# Patient Record
Sex: Female | Born: 1985
Health system: Southern US, Community
[De-identification: ages and names within clinical notes are randomized; demographics above are authoritative.]

## PROBLEM LIST (undated history)

## (undated) ENCOUNTER — Inpatient Hospital Stay (HOSPITAL_COMMUNITY): Payer: Self-pay

## (undated) DIAGNOSIS — A549 Gonococcal infection, unspecified: Secondary | ICD-10-CM

## (undated) DIAGNOSIS — F32A Depression, unspecified: Secondary | ICD-10-CM

## (undated) DIAGNOSIS — N390 Urinary tract infection, site not specified: Secondary | ICD-10-CM

## (undated) DIAGNOSIS — A749 Chlamydial infection, unspecified: Secondary | ICD-10-CM

## (undated) DIAGNOSIS — O24419 Gestational diabetes mellitus in pregnancy, unspecified control: Secondary | ICD-10-CM

## (undated) DIAGNOSIS — O139 Gestational [pregnancy-induced] hypertension without significant proteinuria, unspecified trimester: Secondary | ICD-10-CM

## (undated) DIAGNOSIS — K810 Acute cholecystitis: Secondary | ICD-10-CM

## (undated) DIAGNOSIS — F329 Major depressive disorder, single episode, unspecified: Secondary | ICD-10-CM

## (undated) DIAGNOSIS — K219 Gastro-esophageal reflux disease without esophagitis: Secondary | ICD-10-CM

## (undated) HISTORY — PX: INDUCED ABORTION: SHX677

---
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
## 2007-10-02 ENCOUNTER — Emergency Department (HOSPITAL_COMMUNITY): Admission: EM | Admit: 2007-10-02 | Discharge: 2007-10-03 | Payer: Self-pay | Admitting: Emergency Medicine

## 2008-02-10 ENCOUNTER — Emergency Department (HOSPITAL_COMMUNITY): Admission: EM | Admit: 2008-02-10 | Discharge: 2008-02-10 | Payer: Self-pay | Admitting: Emergency Medicine

## 2008-06-10 ENCOUNTER — Emergency Department (HOSPITAL_COMMUNITY): Admission: EM | Admit: 2008-06-10 | Discharge: 2008-06-10 | Payer: Self-pay | Admitting: Emergency Medicine

## 2008-10-07 ENCOUNTER — Inpatient Hospital Stay (HOSPITAL_COMMUNITY): Admission: AD | Admit: 2008-10-07 | Discharge: 2008-10-07 | Payer: Self-pay | Admitting: Family Medicine

## 2009-03-26 ENCOUNTER — Emergency Department (HOSPITAL_BASED_OUTPATIENT_CLINIC_OR_DEPARTMENT_OTHER): Admission: EM | Admit: 2009-03-26 | Discharge: 2009-03-26 | Payer: Self-pay | Admitting: Emergency Medicine

## 2009-04-07 ENCOUNTER — Emergency Department (HOSPITAL_BASED_OUTPATIENT_CLINIC_OR_DEPARTMENT_OTHER): Admission: EM | Admit: 2009-04-07 | Discharge: 2009-04-07 | Payer: Self-pay | Admitting: Emergency Medicine

## 2009-04-25 ENCOUNTER — Emergency Department (HOSPITAL_COMMUNITY): Admission: EM | Admit: 2009-04-25 | Discharge: 2009-04-25 | Payer: Self-pay | Admitting: Family Medicine

## 2009-05-20 ENCOUNTER — Ambulatory Visit: Payer: Self-pay | Admitting: Diagnostic Radiology

## 2009-05-20 ENCOUNTER — Emergency Department (HOSPITAL_BASED_OUTPATIENT_CLINIC_OR_DEPARTMENT_OTHER): Admission: EM | Admit: 2009-05-20 | Discharge: 2009-05-21 | Payer: Self-pay | Admitting: Emergency Medicine

## 2009-09-22 ENCOUNTER — Emergency Department (HOSPITAL_COMMUNITY): Admission: EM | Admit: 2009-09-22 | Discharge: 2009-09-22 | Payer: Self-pay | Admitting: Emergency Medicine

## 2009-10-22 ENCOUNTER — Inpatient Hospital Stay (HOSPITAL_COMMUNITY): Admission: AD | Admit: 2009-10-22 | Discharge: 2009-10-22 | Payer: Self-pay | Admitting: Obstetrics & Gynecology

## 2009-11-18 ENCOUNTER — Ambulatory Visit (HOSPITAL_COMMUNITY): Admission: RE | Admit: 2009-11-18 | Discharge: 2009-11-18 | Payer: Self-pay | Admitting: *Deleted

## 2009-11-27 ENCOUNTER — Ambulatory Visit (HOSPITAL_COMMUNITY): Admission: RE | Admit: 2009-11-27 | Discharge: 2009-11-27 | Payer: Self-pay | Admitting: *Deleted

## 2009-12-18 ENCOUNTER — Ambulatory Visit (HOSPITAL_COMMUNITY): Admission: RE | Admit: 2009-12-18 | Discharge: 2009-12-18 | Payer: Self-pay | Admitting: Family Medicine

## 2010-01-01 ENCOUNTER — Ambulatory Visit (HOSPITAL_COMMUNITY): Admission: RE | Admit: 2010-01-01 | Discharge: 2010-01-01 | Payer: Self-pay | Admitting: Obstetrics & Gynecology

## 2010-05-15 ENCOUNTER — Ambulatory Visit: Payer: Self-pay | Admitting: Physician Assistant

## 2010-05-15 ENCOUNTER — Inpatient Hospital Stay (HOSPITAL_COMMUNITY): Admission: AD | Admit: 2010-05-15 | Discharge: 2010-05-16 | Payer: Self-pay | Admitting: Family Medicine

## 2010-05-22 ENCOUNTER — Inpatient Hospital Stay (HOSPITAL_COMMUNITY): Admission: AD | Admit: 2010-05-22 | Discharge: 2010-05-22 | Payer: Self-pay | Admitting: Obstetrics & Gynecology

## 2010-06-01 ENCOUNTER — Inpatient Hospital Stay (HOSPITAL_COMMUNITY): Admission: AD | Admit: 2010-06-01 | Discharge: 2010-06-04 | Payer: Self-pay | Admitting: Obstetrics

## 2010-06-10 ENCOUNTER — Ambulatory Visit: Payer: Self-pay | Admitting: Obstetrics and Gynecology

## 2010-06-10 ENCOUNTER — Inpatient Hospital Stay (HOSPITAL_COMMUNITY): Admission: AD | Admit: 2010-06-10 | Discharge: 2010-06-13 | Payer: Self-pay | Admitting: Obstetrics

## 2010-09-13 ENCOUNTER — Emergency Department (HOSPITAL_COMMUNITY): Admission: EM | Admit: 2010-09-13 | Discharge: 2010-09-13 | Payer: Self-pay | Admitting: Family Medicine

## 2010-12-18 ENCOUNTER — Ambulatory Visit (HOSPITAL_COMMUNITY)
Admission: RE | Admit: 2010-12-18 | Discharge: 2010-12-18 | Payer: Self-pay | Source: Home / Self Care | Attending: Obstetrics & Gynecology | Admitting: Obstetrics & Gynecology

## 2011-03-11 LAB — WET PREP, GENITAL
Trich, Wet Prep: NONE SEEN
Yeast Wet Prep HPF POC: NONE SEEN

## 2011-03-11 LAB — HEMOCCULT GUIAC POC 1CARD (OFFICE): Fecal Occult Bld: NEGATIVE

## 2011-03-11 LAB — POCT URINALYSIS DIPSTICK
Bilirubin Urine: NEGATIVE
Glucose, UA: NEGATIVE mg/dL
Hgb urine dipstick: NEGATIVE
Protein, ur: 30 mg/dL — AB

## 2011-03-11 LAB — GC/CHLAMYDIA PROBE AMP, GENITAL
Chlamydia, DNA Probe: NEGATIVE
GC Probe Amp, Genital: NEGATIVE

## 2011-03-14 LAB — COMPREHENSIVE METABOLIC PANEL
AST: 26 U/L (ref 0–37)
Albumin: 2.9 g/dL — ABNORMAL LOW (ref 3.5–5.2)
Alkaline Phosphatase: 321 U/L — ABNORMAL HIGH (ref 39–117)
BUN: 2 mg/dL — ABNORMAL LOW (ref 6–23)
BUN: 6 mg/dL (ref 6–23)
CO2: 25 mEq/L (ref 19–32)
Calcium: 8.4 mg/dL (ref 8.4–10.5)
Chloride: 108 mEq/L (ref 96–112)
Creatinine, Ser: 0.64 mg/dL (ref 0.4–1.2)
GFR calc non Af Amer: >60 mL/min (ref >60)
Glucose, Bld: 68 mg/dL — ABNORMAL LOW (ref 70–99)
Glucose, Bld: 99 mg/dL (ref 70–99)
Potassium: 3.6 mEq/L (ref 3.5–5.1)
Potassium: 3.8 mEq/L (ref 3.5–5.1)
Sodium: 138 mEq/L (ref 135–145)
Total Bilirubin: 0.5 mg/dL (ref 0.3–1.2)
Total Protein: 6.1 g/dL (ref 6.0–8.3)

## 2011-03-14 LAB — CBC
HCT: 32.1 % — ABNORMAL LOW (ref 36.0–46.0)
HCT: 33 % — ABNORMAL LOW (ref 36.0–46.0)
Hemoglobin: 10.5 g/dL — ABNORMAL LOW (ref 12.0–15.0)
MCHC: 32.6 g/dL (ref 30.0–36.0)
MCV: 90 fL (ref 78.0–100.0)
MCV: 90.5 fL (ref 78.0–100.0)
Platelets: 374 10*3/uL (ref 150–400)
Platelets: 382 10*3/uL (ref 150–400)
RBC: 3.66 MIL/uL — ABNORMAL LOW (ref 3.87–5.11)
RDW: 17.6 % — ABNORMAL HIGH (ref 11.5–15.5)

## 2011-03-14 LAB — URINALYSIS, ROUTINE W REFLEX MICROSCOPIC
Glucose, UA: NEGATIVE mg/dL
Ketones, ur: NEGATIVE mg/dL

## 2011-03-14 LAB — LACTATE DEHYDROGENASE: LDH: 260 U/L — ABNORMAL HIGH (ref 94–250)

## 2011-03-14 LAB — URINE MICROSCOPIC-ADD ON

## 2011-03-14 LAB — MRSA PCR SCREENING: MRSA by PCR: NEGATIVE

## 2011-03-14 LAB — URIC ACID: Uric Acid, Serum: 7.2 mg/dL — ABNORMAL HIGH (ref 2.4–7.0)

## 2011-03-15 LAB — CBC
MCHC: 33.4 g/dL (ref 30.0–36.0)
Platelets: 174 10*3/uL (ref 150–400)
RBC: 3.17 MIL/uL — ABNORMAL LOW (ref 3.87–5.11)
RBC: 3.62 MIL/uL — ABNORMAL LOW (ref 3.87–5.11)
RDW: 16.3 % — ABNORMAL HIGH (ref 11.5–15.5)
WBC: 9.9 10*3/uL (ref 4.0–10.5)

## 2011-03-15 LAB — WET PREP, GENITAL

## 2011-03-15 LAB — RPR: RPR Ser Ql: NONREACTIVE

## 2011-04-01 LAB — URINALYSIS, ROUTINE W REFLEX MICROSCOPIC
Bilirubin Urine: NEGATIVE
Ketones, ur: NEGATIVE mg/dL
Nitrite: NEGATIVE
Protein, ur: NEGATIVE mg/dL
Urobilinogen, UA: 0.2 mg/dL (ref 0.0–1.0)

## 2011-04-01 LAB — CBC
HCT: 35.1 % — ABNORMAL LOW (ref 36.0–46.0)
Hemoglobin: 11.7 g/dL — ABNORMAL LOW (ref 12.0–15.0)
MCHC: 33.3 g/dL (ref 30.0–36.0)
MCV: 93.3 fL (ref 78.0–100.0)
RDW: 13.9 % (ref 11.5–15.5)

## 2011-04-01 LAB — GC/CHLAMYDIA PROBE AMP, GENITAL: Chlamydia, DNA Probe: NEGATIVE

## 2011-04-01 LAB — WET PREP, GENITAL

## 2011-04-02 LAB — POCT URINALYSIS DIP (DEVICE)
Bilirubin Urine: NEGATIVE
Protein, ur: 100 mg/dL — AB

## 2011-04-02 LAB — POCT PREGNANCY, URINE: Preg Test, Ur: POSITIVE

## 2011-04-06 LAB — URINALYSIS, ROUTINE W REFLEX MICROSCOPIC
Nitrite: NEGATIVE
Specific Gravity, Urine: 1.031 — ABNORMAL HIGH (ref 1.005–1.030)
pH: 5.5 (ref 5.0–8.0)

## 2011-04-06 LAB — CBC
HCT: 41.8 % (ref 36.0–46.0)
Platelets: 281 10*3/uL (ref 150–400)
WBC: 9.3 10*3/uL (ref 4.0–10.5)

## 2011-04-06 LAB — BASIC METABOLIC PANEL
BUN: 13 mg/dL (ref 6–23)
GFR calc non Af Amer: >60 mL/min (ref >60)
Potassium: 3.9 mEq/L (ref 3.5–5.1)

## 2011-04-06 LAB — RPR: RPR Ser Ql: NONREACTIVE

## 2011-04-06 LAB — DIFFERENTIAL
Eosinophils Relative: 0 % (ref 0–5)
Lymphocytes Relative: 8 % — ABNORMAL LOW (ref 12–46)
Lymphs Abs: 0.7 10*3/uL (ref 0.7–4.0)
Neutro Abs: 8.2 10*3/uL — ABNORMAL HIGH (ref 1.7–7.7)
Neutrophils Relative %: 88 % — ABNORMAL HIGH (ref 43–77)

## 2011-04-06 LAB — URINE MICROSCOPIC-ADD ON

## 2011-04-06 LAB — GC/CHLAMYDIA PROBE AMP, GENITAL: Chlamydia, DNA Probe: NEGATIVE

## 2011-04-06 LAB — PREGNANCY, URINE: Preg Test, Ur: NEGATIVE

## 2011-04-07 LAB — POCT URINALYSIS DIP (DEVICE)
Nitrite: NEGATIVE
Protein, ur: 100 mg/dL — AB
Urobilinogen, UA: 1 mg/dL (ref 0.0–1.0)
pH: 6.5 (ref 5.0–8.0)

## 2011-04-07 LAB — URINALYSIS, ROUTINE W REFLEX MICROSCOPIC
Glucose, UA: NEGATIVE mg/dL
Protein, ur: NEGATIVE mg/dL
Specific Gravity, Urine: 1.027 (ref 1.005–1.030)

## 2011-04-07 LAB — URINE MICROSCOPIC-ADD ON

## 2011-04-07 LAB — RAPID STREP SCREEN (MED CTR MEBANE ONLY): Streptococcus, Group A Screen (Direct): NEGATIVE

## 2011-04-07 LAB — STREP A DNA PROBE: Group A Strep Probe: NEGATIVE

## 2011-04-08 LAB — URINALYSIS, ROUTINE W REFLEX MICROSCOPIC
Protein, ur: NEGATIVE mg/dL
Specific Gravity, Urine: 1.024 (ref 1.005–1.030)
Urobilinogen, UA: 0.2 mg/dL (ref 0.0–1.0)

## 2011-04-08 LAB — URINE MICROSCOPIC-ADD ON

## 2011-04-08 LAB — WET PREP, GENITAL
Trich, Wet Prep: NONE SEEN
Yeast Wet Prep HPF POC: NONE SEEN

## 2011-04-14 ENCOUNTER — Inpatient Hospital Stay (HOSPITAL_COMMUNITY): Payer: 59

## 2011-04-14 ENCOUNTER — Inpatient Hospital Stay (HOSPITAL_COMMUNITY)
Admission: AD | Admit: 2011-04-14 | Discharge: 2011-04-14 | Disposition: A | Discharge status: Home / Self Care | Payer: 59 | Source: Ambulatory Visit | Attending: Obstetrics & Gynecology | Admitting: Obstetrics & Gynecology

## 2011-04-14 DIAGNOSIS — R109 Unspecified abdominal pain: Secondary | ICD-10-CM

## 2011-04-14 DIAGNOSIS — N390 Urinary tract infection, site not specified: Secondary | ICD-10-CM

## 2011-04-14 DIAGNOSIS — O2390 Unspecified genitourinary tract infection in pregnancy, unspecified trimester: Secondary | ICD-10-CM | POA: Insufficient documentation

## 2011-04-14 LAB — WET PREP, GENITAL
Clue Cells Wet Prep HPF POC: NONE SEEN
Trich, Wet Prep: NONE SEEN

## 2011-04-14 LAB — HCG, QUANTITATIVE, PREGNANCY: hCG, Beta Chain, Quant, S: 12213 m[IU]/mL — ABNORMAL HIGH (ref <5)

## 2011-04-14 LAB — URINALYSIS, ROUTINE W REFLEX MICROSCOPIC
Bilirubin Urine: NEGATIVE
Ketones, ur: NEGATIVE mg/dL
Nitrite: POSITIVE — AB
Protein, ur: NEGATIVE mg/dL
Urobilinogen, UA: 0.2 mg/dL (ref 0.0–1.0)

## 2011-04-14 LAB — POCT PREGNANCY, URINE: Preg Test, Ur: POSITIVE

## 2011-04-14 LAB — URINE MICROSCOPIC-ADD ON

## 2011-04-15 LAB — GC/CHLAMYDIA PROBE AMP, GENITAL
Chlamydia, DNA Probe: NEGATIVE
GC Probe Amp, Genital: NEGATIVE

## 2011-04-16 LAB — URINE CULTURE

## 2011-05-18 LAB — STREP B DNA PROBE: GBS: NEGATIVE

## 2011-05-18 LAB — ABO/RH: RH Type: POSITIVE

## 2011-05-18 LAB — RUBELLA ANTIBODY, IGM: Rubella: IMMUNE

## 2011-05-18 LAB — HEPATITIS B SURFACE ANTIGEN: Hepatitis B Surface Ag: NEGATIVE

## 2011-05-18 LAB — HIV ANTIBODY (ROUTINE TESTING W REFLEX): HIV: NONREACTIVE

## 2011-05-25 ENCOUNTER — Other Ambulatory Visit: Payer: Self-pay | Admitting: Obstetrics

## 2011-05-25 DIAGNOSIS — Z3689 Encounter for other specified antenatal screening: Secondary | ICD-10-CM

## 2011-05-27 ENCOUNTER — Ambulatory Visit (HOSPITAL_COMMUNITY): Payer: 59

## 2011-06-01 ENCOUNTER — Ambulatory Visit (HOSPITAL_COMMUNITY)
Admission: RE | Admit: 2011-06-01 | Discharge: 2011-06-01 | Disposition: A | Discharge status: Home / Self Care | Payer: 59 | Source: Ambulatory Visit | Attending: Obstetrics | Admitting: Obstetrics

## 2011-06-01 DIAGNOSIS — Z3689 Encounter for other specified antenatal screening: Secondary | ICD-10-CM

## 2011-06-01 DIAGNOSIS — Z1389 Encounter for screening for other disorder: Secondary | ICD-10-CM | POA: Insufficient documentation

## 2011-06-01 DIAGNOSIS — Z363 Encounter for antenatal screening for malformations: Secondary | ICD-10-CM | POA: Insufficient documentation

## 2011-06-01 DIAGNOSIS — O358XX0 Maternal care for other (suspected) fetal abnormality and damage, not applicable or unspecified: Secondary | ICD-10-CM | POA: Insufficient documentation

## 2011-06-04 ENCOUNTER — Inpatient Hospital Stay (HOSPITAL_COMMUNITY)
Admission: AD | Admit: 2011-06-04 | Discharge: 2011-06-04 | Disposition: A | Discharge status: Home / Self Care | Payer: Managed Care, Other (non HMO) | Source: Ambulatory Visit | Attending: Obstetrics & Gynecology | Admitting: Obstetrics & Gynecology

## 2011-06-04 DIAGNOSIS — R109 Unspecified abdominal pain: Secondary | ICD-10-CM | POA: Insufficient documentation

## 2011-06-04 DIAGNOSIS — O9989 Other specified diseases and conditions complicating pregnancy, childbirth and the puerperium: Secondary | ICD-10-CM

## 2011-06-04 DIAGNOSIS — O99891 Other specified diseases and conditions complicating pregnancy: Secondary | ICD-10-CM

## 2011-06-04 DIAGNOSIS — K5900 Constipation, unspecified: Secondary | ICD-10-CM | POA: Insufficient documentation

## 2011-06-04 LAB — URINALYSIS, ROUTINE W REFLEX MICROSCOPIC
Bilirubin Urine: NEGATIVE
Nitrite: NEGATIVE
Specific Gravity, Urine: 1.02 (ref 1.005–1.030)
Urobilinogen, UA: 1 mg/dL (ref 0.0–1.0)

## 2011-06-04 LAB — URINE MICROSCOPIC-ADD ON

## 2011-06-07 LAB — URINE CULTURE
Colony Count: 95000
Culture  Setup Time: 201206082125

## 2011-08-26 ENCOUNTER — Inpatient Hospital Stay (HOSPITAL_COMMUNITY)
Admission: AD | Admit: 2011-08-26 | Discharge: 2011-08-26 | Disposition: A | Discharge status: Home / Self Care | Payer: Managed Care, Other (non HMO) | Source: Ambulatory Visit | Attending: Obstetrics | Admitting: Obstetrics

## 2011-08-26 ENCOUNTER — Encounter (HOSPITAL_COMMUNITY): Payer: Self-pay

## 2011-08-26 DIAGNOSIS — O4700 False labor before 37 completed weeks of gestation, unspecified trimester: Secondary | ICD-10-CM

## 2011-08-26 HISTORY — DX: Gonococcal infection, unspecified: A54.9

## 2011-08-26 HISTORY — DX: Urinary tract infection, site not specified: N39.0

## 2011-08-26 HISTORY — DX: Gestational (pregnancy-induced) hypertension without significant proteinuria, unspecified trimester: O13.9

## 2011-08-26 HISTORY — DX: Chlamydial infection, unspecified: A74.9

## 2011-08-26 MED ORDER — NIFEDIPINE 10 MG PO CAPS
20.0000 mg | ORAL_CAPSULE | Freq: Once | ORAL | Status: AC
  Administered 2011-08-26: 20 mg via ORAL
  Filled 2011-08-26: qty 2

## 2011-08-26 MED ORDER — FLUCONAZOLE 150 MG PO TABS: 150.0000 mg | ORAL_TABLET | Freq: Once | ORAL | Status: AC

## 2011-08-26 MED ORDER — LACTATED RINGERS IV BOLUS (SEPSIS)
1000.0000 mL | Freq: Once | INTRAVENOUS | Status: AC
  Administered 2011-08-26: 1000 mL via INTRAVENOUS

## 2011-08-26 MED ORDER — CLINDAMYCIN HCL 300 MG PO CAPS: 300.0000 mg | ORAL_CAPSULE | Freq: Four times a day (QID) | ORAL | Status: AC

## 2011-08-26 NOTE — ED Provider Notes (Signed)
History   Pt presents today after being seen by Dr. Clearance Coots in his office. She was noted to have ctx every 2-3 min so she was sent to the MAU for IV hydration and procardia. She denies vag dc, bleeding, or any other sx. She reports GFM. Her cervix in the office was Lg/closed.  Chief Complaint  Patient presents with  . Contractions   HPI  OB History    Grav Para Term Preterm Abortions TAB SAB Ect Mult Living   4 1 1  0 2 1 1  0 0 1      Past Medical History  Diagnosis Date  . Pregnancy induced hypertension     after first delivery, post partem  . Urinary tract infection   . Chlamydia   . Gonorrhea     Past Surgical History  Procedure Date  . Induced abortion     No family history on file.  History  Substance Use Topics  . Smoking status: Never Smoker   . Smokeless tobacco: Never Used  . Alcohol Use: No     none with preg    Allergies: No Known Allergies  Prescriptions prior to admission  Medication Sig Dispense Refill  . omeprazole (PRILOSEC) 20 MG capsule Take 20 mg by mouth daily.        . prenatal vitamin w/FE, FA (PRENATAL 1 + 1) 27-1 MG TABS Take 1 tablet by mouth daily.        . Vitamin D, Ergocalciferol, (DRISDOL) 50000 UNITS CAPS Take 50,000 Units by mouth.          Review of Systems  Constitutional: Negative for fever.  Cardiovascular: Negative for chest pain.  Gastrointestinal: Positive for abdominal pain. Negative for nausea, vomiting, diarrhea and constipation.  Genitourinary: Negative for dysuria, urgency, frequency and hematuria.  Neurological: Negative for dizziness and headaches.  Psychiatric/Behavioral: Negative for depression and suicidal ideas.   Physical Exam   Blood pressure 125/73, pulse 96, temperature 98.7 F (37.1 C), temperature source Oral, resp. rate 16, height 5' 3.5" (1.613 m), weight 190 lb 6.4 oz (86.365 kg), SpO2 98.00%, unknown if currently breastfeeding.  Physical Exam  Constitutional: She is oriented to person, place, and  time. She appears well-developed and well-nourished.  HENT:  Head: Normocephalic and atraumatic.  Eyes: EOM are normal. Pupils are equal, round, and reactive to light.  GI: Soft. She exhibits no distension. There is no tenderness. There is no rebound and no guarding.  Neurological: She is alert and oriented to person, place, and time.  Skin: Skin is warm and dry.  Psychiatric: She has a normal mood and affect. Her behavior is normal. Judgment and thought content normal.    MAU Course  Procedures  Pt ctx have now spaced out to 20-30 min following IV hydration and procardia. She states her pain has now gone from about an 8-10 to less than a 4. She states she feels much better.  Discussed with Dr. Clearance Coots at length. He wishes to dc her with clindamycin and pelvic rest. She has f/u in the office.  Assessment and Plan  Threatened preterm labor: discussed with pt at length. Will start on clindamycin. Discussed diet, activity, risks, and precautions.  Clinton Gallant. Rice III, DrHSc, MPAS, PA-C  08/26/2011, 1:52 PM

## 2011-08-26 NOTE — Progress Notes (Signed)
Pt states she was seen in the office and having contractions every 10 minutes. Sent to MAU for evaluation of pre term labor. Reports good fetal movement, no bleeding or leaking.

## 2011-09-13 ENCOUNTER — Inpatient Hospital Stay (HOSPITAL_COMMUNITY)
Admission: AD | Admit: 2011-09-13 | Discharge: 2011-09-16 | DRG: 775 | Disposition: A | Discharge status: Home / Self Care | Payer: Managed Care, Other (non HMO) | Source: Ambulatory Visit | Attending: Obstetrics & Gynecology | Admitting: Obstetrics & Gynecology

## 2011-09-13 ENCOUNTER — Encounter (HOSPITAL_COMMUNITY): Payer: Self-pay | Admitting: *Deleted

## 2011-09-13 NOTE — Progress Notes (Signed)
Pt reports ROM at 2230, clear fluid, contracting. G3P1

## 2011-09-13 NOTE — Progress Notes (Signed)
Pt states she felt  a gush of clear fluid @ 2230 and she went to [facility] to change clothes.  Has not felt leaking since then.

## 2011-09-14 ENCOUNTER — Inpatient Hospital Stay (HOSPITAL_COMMUNITY): Payer: Managed Care, Other (non HMO) | Admitting: Anesthesiology

## 2011-09-14 ENCOUNTER — Encounter (HOSPITAL_COMMUNITY): Payer: Self-pay | Admitting: *Deleted

## 2011-09-14 ENCOUNTER — Encounter (HOSPITAL_COMMUNITY): Payer: Self-pay | Admitting: Anesthesiology

## 2011-09-14 LAB — CBC
HCT: 32.7 % — ABNORMAL LOW (ref 36.0–46.0)
MCH: 27.5 pg (ref 26.0–34.0)
MCV: 86.5 fL (ref 78.0–100.0)
Platelets: 186 10*3/uL (ref 150–400)
RDW: 15.4 % (ref 11.5–15.5)
WBC: 5.5 10*3/uL (ref 4.0–10.5)

## 2011-09-14 MED ORDER — ZOLPIDEM TARTRATE 5 MG PO TABS: 5.0000 mg | ORAL_TABLET | Freq: Every evening | ORAL | Status: DC | PRN

## 2011-09-14 MED ORDER — DIPHENHYDRAMINE HCL 25 MG PO CAPS: 25.0000 mg | ORAL_CAPSULE | Freq: Four times a day (QID) | ORAL | Status: DC | PRN

## 2011-09-14 MED ORDER — LIDOCAINE HCL (PF) 1 % IJ SOLN
30.0000 mL | INTRAMUSCULAR | Status: DC | PRN
  Filled 2011-09-14 (×2): qty 30

## 2011-09-14 MED ORDER — OXYCODONE-ACETAMINOPHEN 5-325 MG PO TABS
2.0000 | ORAL_TABLET | ORAL | Status: DC | PRN
  Administered 2011-09-14 (×2): 1 via ORAL
  Filled 2011-09-14 (×2): qty 1

## 2011-09-14 MED ORDER — EPHEDRINE 5 MG/ML INJ
10.0000 mg | INTRAVENOUS | Status: DC | PRN
  Filled 2011-09-14: qty 4

## 2011-09-14 MED ORDER — ONDANSETRON HCL 4 MG PO TABS: 4.0000 mg | ORAL_TABLET | ORAL | Status: DC | PRN

## 2011-09-14 MED ORDER — LACTATED RINGERS IV SOLN
500.0000 mL | Freq: Once | INTRAVENOUS | Status: AC
  Administered 2011-09-14: 500 mL via INTRAVENOUS

## 2011-09-14 MED ORDER — IBUPROFEN 600 MG PO TABS
600.0000 mg | ORAL_TABLET | Freq: Four times a day (QID) | ORAL | Status: DC
  Administered 2011-09-14 – 2011-09-16 (×7): 600 mg via ORAL
  Filled 2011-09-14 (×7): qty 1

## 2011-09-14 MED ORDER — TETANUS-DIPHTH-ACELL PERTUSSIS 5-2.5-18.5 LF-MCG/0.5 IM SUSP
0.5000 mL | Freq: Once | INTRAMUSCULAR | Status: DC
  Filled 2011-09-14: qty 0.5

## 2011-09-14 MED ORDER — PHENYLEPHRINE 40 MCG/ML (10ML) SYRINGE FOR IV PUSH (FOR BLOOD PRESSURE SUPPORT)
80.0000 ug | PREFILLED_SYRINGE | INTRAVENOUS | Status: DC | PRN
  Filled 2011-09-14 (×2): qty 5

## 2011-09-14 MED ORDER — SIMETHICONE 80 MG PO CHEW: 80.0000 mg | CHEWABLE_TABLET | ORAL | Status: DC | PRN

## 2011-09-14 MED ORDER — EPHEDRINE 5 MG/ML INJ
10.0000 mg | INTRAVENOUS | Status: DC | PRN
  Filled 2011-09-14 (×2): qty 4

## 2011-09-14 MED ORDER — LACTATED RINGERS IV SOLN
INTRAVENOUS | Status: DC
  Administered 2011-09-14: 02:00:00 via INTRAVENOUS

## 2011-09-14 MED ORDER — OXYCODONE-ACETAMINOPHEN 5-325 MG PO TABS
1.0000 | ORAL_TABLET | ORAL | Status: DC | PRN
  Administered 2011-09-14 – 2011-09-16 (×8): 1 via ORAL
  Filled 2011-09-14 (×8): qty 1

## 2011-09-14 MED ORDER — ONDANSETRON HCL 4 MG/2ML IJ SOLN: 4.0000 mg | Freq: Four times a day (QID) | INTRAMUSCULAR | Status: DC | PRN

## 2011-09-14 MED ORDER — DIBUCAINE 1 % RE OINT: 1.0000 "application " | TOPICAL_OINTMENT | RECTAL | Status: DC | PRN

## 2011-09-14 MED ORDER — OXYTOCIN BOLUS FROM INFUSION
500.0000 mL | Freq: Once | INTRAVENOUS | Status: DC
  Filled 2011-09-14: qty 500

## 2011-09-14 MED ORDER — ACETAMINOPHEN 325 MG PO TABS: 650.0000 mg | ORAL_TABLET | ORAL | Status: DC | PRN

## 2011-09-14 MED ORDER — TERBUTALINE SULFATE 1 MG/ML IJ SOLN: 0.2500 mg | Freq: Once | INTRAMUSCULAR | Status: AC | PRN

## 2011-09-14 MED ORDER — LACTATED RINGERS IV SOLN: 500.0000 mL | INTRAVENOUS | Status: DC | PRN

## 2011-09-14 MED ORDER — SENNOSIDES-DOCUSATE SODIUM 8.6-50 MG PO TABS
2.0000 | ORAL_TABLET | Freq: Every day | ORAL | Status: DC
  Administered 2011-09-14 – 2011-09-15 (×2): 2 via ORAL

## 2011-09-14 MED ORDER — OXYTOCIN 20 UNITS IN LACTATED RINGERS INFUSION - SIMPLE
125.0000 mL/h | Freq: Once | INTRAVENOUS | Status: DC
  Administered 2011-09-14: 999 mL/h via INTRAVENOUS
  Filled 2011-09-14: qty 1000

## 2011-09-14 MED ORDER — CITRIC ACID-SODIUM CITRATE 334-500 MG/5ML PO SOLN: 30.0000 mL | ORAL | Status: DC | PRN

## 2011-09-14 MED ORDER — PRENATAL PLUS 27-1 MG PO TABS
1.0000 | ORAL_TABLET | Freq: Every day | ORAL | Status: DC
  Administered 2011-09-15 – 2011-09-16 (×2): 1 via ORAL
  Filled 2011-09-14 (×2): qty 1

## 2011-09-14 MED ORDER — SODIUM BICARBONATE 8.4 % IV SOLN
INTRAVENOUS | Status: DC | PRN
  Administered 2011-09-14: 5 mL via EPIDURAL

## 2011-09-14 MED ORDER — BENZOCAINE-MENTHOL 20-0.5 % EX AERO: 1.0000 "application " | INHALATION_SPRAY | CUTANEOUS | Status: DC | PRN

## 2011-09-14 MED ORDER — IBUPROFEN 600 MG PO TABS
600.0000 mg | ORAL_TABLET | Freq: Four times a day (QID) | ORAL | Status: DC | PRN
  Administered 2011-09-14: 600 mg via ORAL
  Filled 2011-09-14: qty 1

## 2011-09-14 MED ORDER — PHENYLEPHRINE 40 MCG/ML (10ML) SYRINGE FOR IV PUSH (FOR BLOOD PRESSURE SUPPORT)
80.0000 ug | PREFILLED_SYRINGE | INTRAVENOUS | Status: DC | PRN
  Filled 2011-09-14: qty 5

## 2011-09-14 MED ORDER — ONDANSETRON HCL 4 MG/2ML IJ SOLN: 4.0000 mg | INTRAMUSCULAR | Status: DC | PRN

## 2011-09-14 MED ORDER — LANOLIN HYDROUS EX OINT: TOPICAL_OINTMENT | CUTANEOUS | Status: DC | PRN

## 2011-09-14 MED ORDER — FLEET ENEMA 7-19 GM/118ML RE ENEM: 1.0000 | ENEMA | RECTAL | Status: DC | PRN

## 2011-09-14 MED ORDER — DIPHENHYDRAMINE HCL 50 MG/ML IJ SOLN: 12.5000 mg | INTRAMUSCULAR | Status: DC | PRN

## 2011-09-14 MED ORDER — FENTANYL 2.5 MCG/ML BUPIVACAINE 1/10 % EPIDURAL INFUSION (WH - ANES)
14.0000 mL/h | INTRAMUSCULAR | Status: DC
  Administered 2011-09-14 (×2): 14 mL/h via EPIDURAL
  Filled 2011-09-14 (×2): qty 60

## 2011-09-14 MED ORDER — WITCH HAZEL-GLYCERIN EX PADS: 1.0000 "application " | MEDICATED_PAD | CUTANEOUS | Status: DC | PRN

## 2011-09-14 NOTE — H&P (Signed)
Erin Bullock is a 25 y.o. female presenting for SROM. Maternal Medical History:  Reason for admission: Reason for admission: rupture of membranes.  Reason for Admission:   nauseaFetal activity: Perceived fetal activity is normal.    Prenatal complications: Hypertension.     OB History    Grav Para Term Preterm Abortions TAB SAB Ect Mult Living   4 2 2  0 2 1 1  0 0 2     Past Medical History  Diagnosis Date  . Pregnancy induced hypertension     after first delivery, post partem  . Urinary tract infection   . Chlamydia   . Gonorrhea    Past Surgical History  Procedure Date  . Induced abortion    Family History: family history includes Cancer in her maternal aunt and mother; Diabetes in her mother; Heart disease in her maternal grandmother; Hypertension in her father and mother; and Stroke in her mother. Social History:  reports that she has never smoked. She has never used smokeless tobacco. She reports that she does not drink alcohol or use illicit drugs.  Review of Systems  Constitutional: Negative for fever.  Eyes: Negative for blurred vision.  Respiratory: Negative for shortness of breath.   Gastrointestinal: Negative for nausea and vomiting.  Skin: Negative for rash.  Neurological: Negative for headaches.    Dilation:  (svd) Effacement (%): 100 Station: Crowning Exam by:: L. Lima, RN Blood pressure 135/77, pulse 85, temperature 98.3 F (36.8 C), temperature source Oral, resp. rate 16, height 5\' 1"  (1.549 m), weight 86.183 kg (190 lb), unknown if currently breastfeeding. Maternal Exam:  Abdomen: Patient reports no abdominal tenderness. Fetal presentation: vertex  Introitus: not evaluated.     Fetal Exam Fetal Monitor Review: Variability: moderate (6-25 bpm).   Pattern: accelerations present and no decelerations.    Fetal State Assessment: Category I - tracings are normal.     Physical Exam  Constitutional: She appears well-developed.  HENT:  Head:  Normocephalic.  Neck: Neck supple. No thyromegaly present.  Cardiovascular: Normal rate and regular rhythm.   Respiratory: Breath sounds normal.  GI: Soft. Bowel sounds are normal.  Skin: No rash noted.    Prenatal labs: ABO, Rh: A/Positive/-- (05/22 0000) Antibody: Negative (05/22 0000) Rubella:   RPR: Nonreactive (05/22 0000)  HBsAg: Negative (05/22 0000)  HIV: Non-reactive (05/22 0000)  GBS: Negative (05/22 0000)   Assessment/Plan: 25 y.o. w/an IUP @ [redacted]w[redacted]d w/SROM  Admit Possible augmentation of labor w/Pitocin    JACKSON-MOORE,Dalissa Lovin A 09/14/2011, 12:17 PM

## 2011-09-14 NOTE — ED Provider Notes (Signed)
Fern pos EFM: 130-140's min-mod variability, 15x15 accels, not decels Toco: Q 3-6, mod  Previn Jian 09/14/2011 12:39 AM

## 2011-09-14 NOTE — Anesthesia Procedure Notes (Signed)
Epidural Patient location during procedure: OB  Staffing Anesthesiologist: Idelia Caudell EDWARD  Preanesthetic Checklist Completed: patient identified, site marked, surgical consent, pre-op evaluation, timeout performed, IV checked, risks and benefits discussed and monitors and equipment checked  Epidural Patient position: sitting Prep: site prepped and draped and DuraPrep Patient monitoring: continuous pulse ox and blood pressure Approach: midline Injection technique: LOR air  Needle:  Needle type: Tuohy  Needle gauge: 17 G Needle length: 9 cm Needle insertion depth: 5 cm cm Catheter type: closed end flexible Catheter size: 19 Gauge Catheter at skin depth: 10 cm Test dose: negative  Assessment Events: blood not aspirated, injection not painful, no injection resistance, negative IV test and no paresthesia  Additional Notes Dosing of Epidural: 1st dose, through needle...... ( mg expressed as equavilent  cc's medication  from .1%Bupiv / fentanyl syringe from L&D pump)...............  5mg Marcaine  2nd dose, through catheter after waiting 3 minutes.... epi 1:200K + Xylocaine 40 mg 3rd dose, through catheter, after waiting 3 minutes.....epi 1:200K + Xylocaine 60 mg ( 2% Xylo charted as a single dose in Epic Meds for ease of charting; actual dosing was fractionated as above, for saftey's sake)  As each dose occurred, patient was free of IV sx; and patient exhibited no evidence of SA injection.  Patient is more comfortable after epidural dosed. Please see RN's note for documentation of vital signs,and FHR which are stable.    

## 2011-09-14 NOTE — Anesthesia Preprocedure Evaluation (Signed)
Anesthesia Evaluation  Name, MR# and DOB Patient awake  General Assessment Comment  Reviewed: Allergy & Precautions, H&P , Patient's Chart, lab work & pertinent test results  Airway Mallampati: II TM Distance: >3 FB Neck ROM: full    Dental  (+) Teeth Intact   Pulmonary  clear to auscultation  breath sounds clear to auscultation none    Cardiovascular hypertension (Hx of PIH), regular Normal    Neuro/Psych   GI/Hepatic/Renal   Endo/Other    Abdominal   Musculoskeletal   Hematology   Peds  Reproductive/Obstetrics (+) Pregnancy    Anesthesia Other Findings                 Anesthesia Physical Anesthesia Plan  ASA: II  Anesthesia Plan: Epidural   Post-op Pain Management:    Induction:   Airway Management Planned:   Additional Equipment:   Intra-op Plan:   Post-operative Plan:   Informed Consent: I have reviewed the patients History and Physical, chart, labs and discussed the procedure including the risks, benefits and alternatives for the proposed anesthesia with the patient or authorized representative who has indicated his/her understanding and acceptance.   Dental Advisory Given  Plan Discussed with: CRNA and Surgeon  Anesthesia Plan Comments: (Labs checked- platelets confirmed with RN in room. Fetal heart tracing, per RN, reportedly stable enough for sitting procedure. Discussed epidural, and patient consents to the procedure:  included risk of possible headache,backache, failed block, allergic reaction, and nerve injury. This patient was asked if she had any questions or concerns before the procedure started. )        Anesthesia Quick Evaluation

## 2011-09-15 LAB — CBC
MCV: 87.2 fL (ref 78.0–100.0)
Platelets: 141 10*3/uL — ABNORMAL LOW (ref 150–400)
RDW: 15.5 % (ref 11.5–15.5)
WBC: 9.3 10*3/uL (ref 4.0–10.5)

## 2011-09-15 NOTE — Progress Notes (Signed)
  Post Partum Day 1 S/P spontaneous vaginal RH status/Rubella reviewed.  Feeding: breast Subjective: No HA, SOB, CP, F/C, breast symptoms. Normal vaginal bleeding, no clots.     Objective: BP 107/66  Pulse 63  Temp(Src) 97.8 F (36.6 C) (Oral)  Resp 18  Ht 5\' 1"  (1.549 m)  Wt 86.183 kg (190 lb)  BMI 35.90 kg/m2  Breastfeeding? Unknown   Physical Exam:  General: alert Lochia: appropriate Uterine Fundus: firm DVT Evaluation: No evidence of DVT seen on physical exam. Ext: No c/c/e  Basename 09/15/11 0515 09/14/11 0148  HGB 9.6* 10.4*  HCT 30.1* 32.7*      Assessment/Plan: 25 y.o.  PPD #1 .  normal postpartum exam Continue current postpartum care  Ambulate   LOS: 2 days   JACKSON-MOORE,Roc Streett A 09/15/2011, 9:18 AM

## 2011-09-15 NOTE — Anesthesia Postprocedure Evaluation (Signed)
  Anesthesia Post-op Note  Patient: Erin Bullock  Procedure(s) Performed: * No procedures listed *  Patient Location: PACU and Mother/Baby  Anesthesia Type: Epidural  Level of Consciousness: awake, alert  and oriented  Airway and Oxygen Therapy: Patient Spontanous Breathing  Post-op Pain: mild  Post-op Assessment: Patient's Cardiovascular Status Stable and Respiratory Function Stable  Post-op Vital Signs: stable  Complications: No apparent anesthesia complications

## 2011-09-16 MED ORDER — OXYCODONE-ACETAMINOPHEN 5-325 MG PO TABS: 2.0000 | ORAL_TABLET | Freq: Four times a day (QID) | ORAL | Status: AC | PRN

## 2011-09-16 MED ORDER — IBUPROFEN 800 MG PO TABS: 800.0000 mg | ORAL_TABLET | Freq: Three times a day (TID) | ORAL | Status: AC | PRN

## 2011-09-16 NOTE — Progress Notes (Signed)
Post Partum Day #2 S/P:spontaneous vaginal  RH status/Rubella reviewed.  Feeding: breast Subjective: No HA, SOB, CP, F/C, breast symptoms: No. Normal vaginal bleeding, no clots.     Objective:  Blood pressure 95/63, pulse 65, temperature 98 F (36.7 C), temperature source Oral, resp. rate 18, height 5\' 1"  (1.549 m), weight 86.183 kg (190 lb), unknown if currently breastfeeding.   Physical Exam:  General: alert Lochia: appropriate Uterine Fundus: firm DVT Evaluation: No evidence of DVT seen on physical exam. Ext: No c/c/e  Basename 09/15/11 0515 09/14/11 0148  HGB 9.6* 10.4*  HCT 30.1* 32.7*    Assessment/Plan: 24 y.o.  PPD # 2 .  normal postpartum exam Continue current postpartum care D/C home   LOS: 3 days   JACKSON-MOORE,Allister Lessley A 09/16/2011, 9:36 AM

## 2011-09-16 NOTE — Discharge Summary (Signed)
  Obstetric Discharge Summary Reason for Admission: onset of labor Prenatal Procedures: none Intrapartum Procedures: spontaneous vaginal delivery Postpartum Procedures: none Complications-Operative and Postpartum: none  Hemoglobin  Date Value Range Status  09/15/2011 9.6* 12.0-15.0 (g/dL) Final     HCT  Date Value Range Status  09/15/2011 30.1* 36.0-46.0 (%) Final    Discharge Diagnoses: Term Pregnancy-delivered  Discharge Information: Date: 09/16/2011 Activity: pelvic rest Diet: routine Medications: Ibuprofen, Percocet Condition: stable Instructions: refer to routine discharge instructions Discharge to: home Follow-up Information    Follow up with Antionette Char A, MD. Call in 4 weeks.   Contact information:   [address], [address] [place] [address] [phone]          Newborn Data: Live born  Information for the patient's newborn:  Anaisha, Mago [[id number]]  female ; APGAR , ; weight ;  Home with mother.  JACKSON-MOORE,Abimelec Grochowski A 09/16/2011, 9:39 AM

## 2011-09-17 LAB — URINALYSIS, ROUTINE W REFLEX MICROSCOPIC
Bilirubin Urine: NEGATIVE
Glucose, UA: NEGATIVE
Hgb urine dipstick: NEGATIVE
Ketones, ur: NEGATIVE
Protein, ur: 30 — AB

## 2011-09-17 LAB — URINE MICROSCOPIC-ADD ON

## 2011-09-23 LAB — WET PREP, GENITAL
Trich, Wet Prep: NEGATIVE — AB
Yeast Wet Prep HPF POC: NEGATIVE — AB

## 2011-09-23 LAB — GC/CHLAMYDIA PROBE AMP, GENITAL
Chlamydia, DNA Probe: NEGATIVE
GC Probe Amp, Genital: NEGATIVE

## 2012-04-20 IMAGING — US US TRANSVAGINAL NON-OB
1 series · 14 of 25 positions shown · non-contrast
Comparison: None.

CLINICAL DATA: Pain for 6 months.  History of uterine fibroids.
LMP 12/12/2010



[Series 1: us transvaginal non-ob · 14 of 46 slices shown]
[im 1/46]
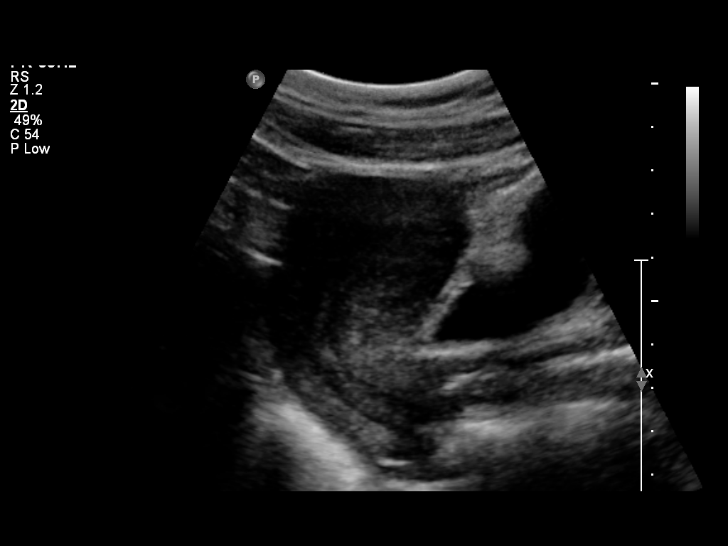
[im 4/46]
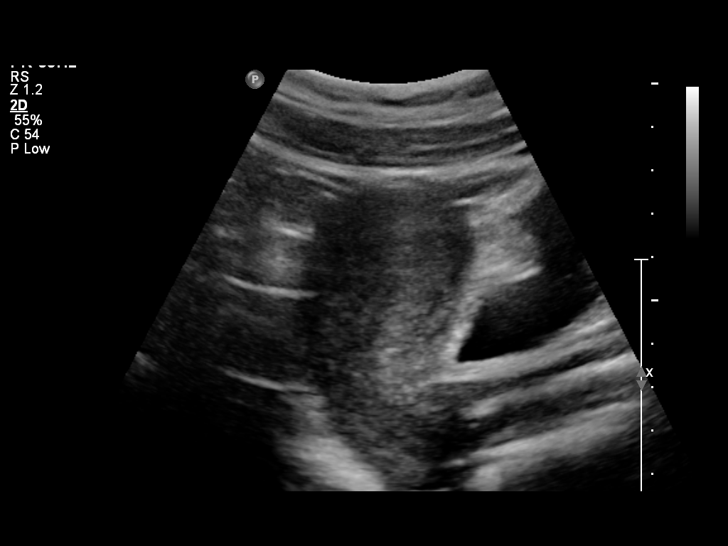
[im 8/46]
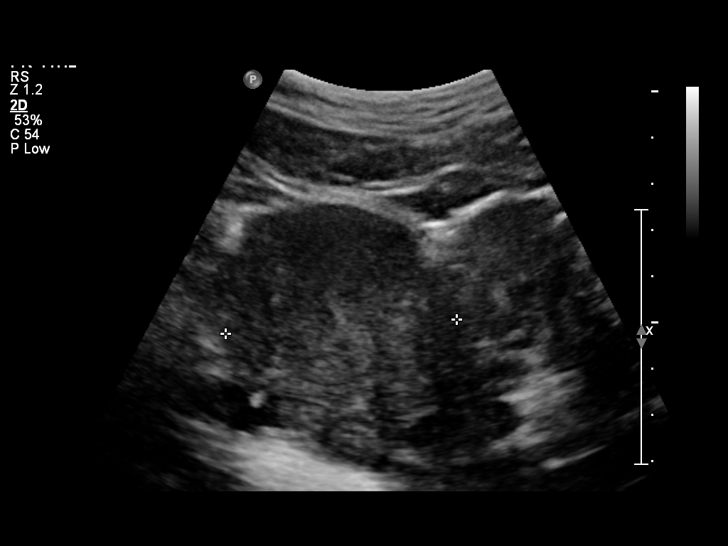
[im 12/46]
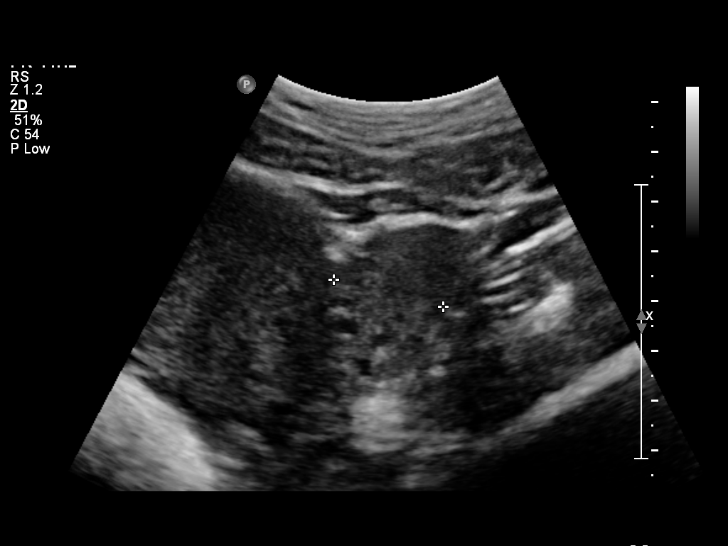
[im 16/46]
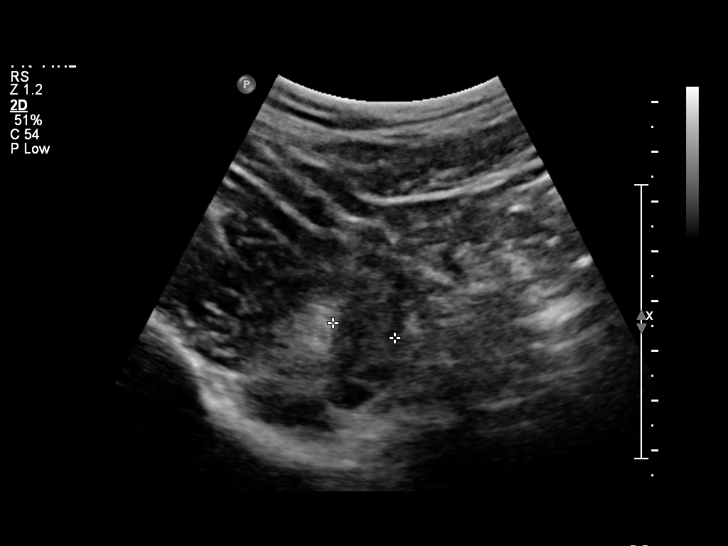
[im 17/46]
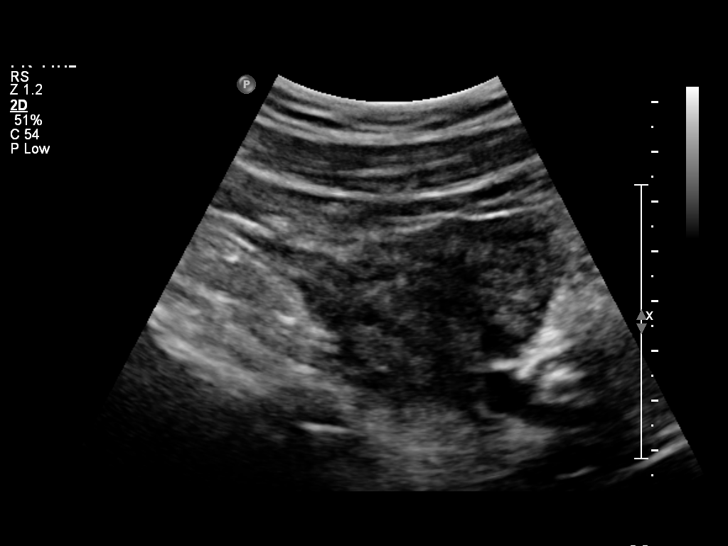
[im 21/46]
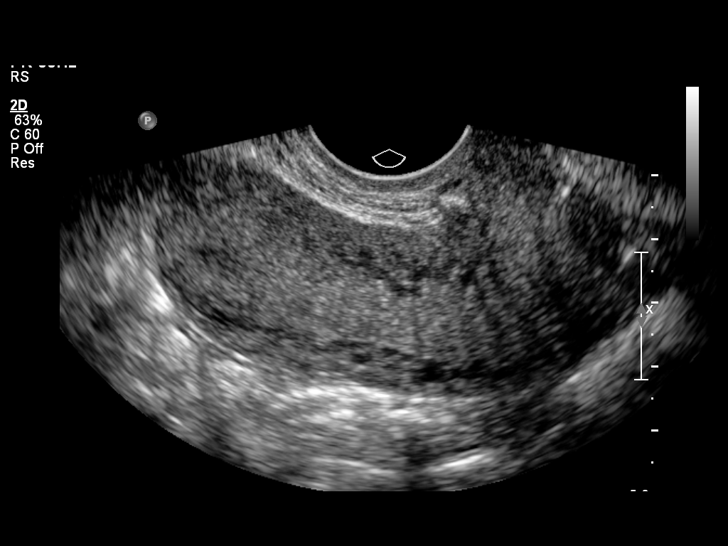
[im 25/46]
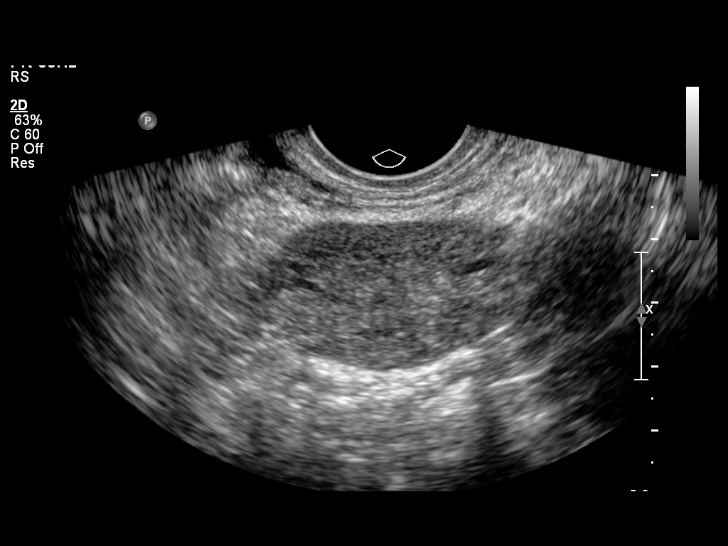
[im 29/46]
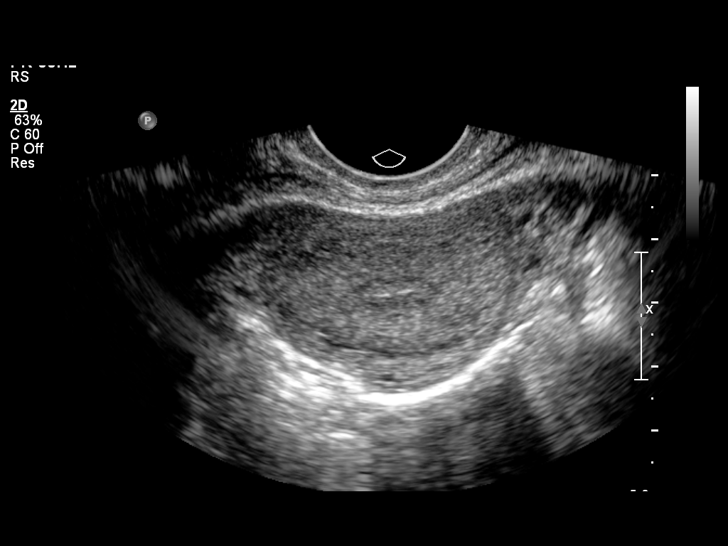
[im 31/46]
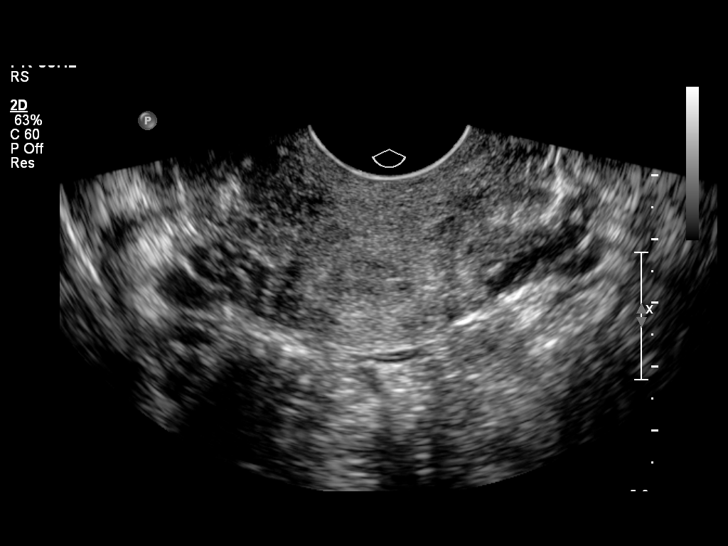
[im 34/46]
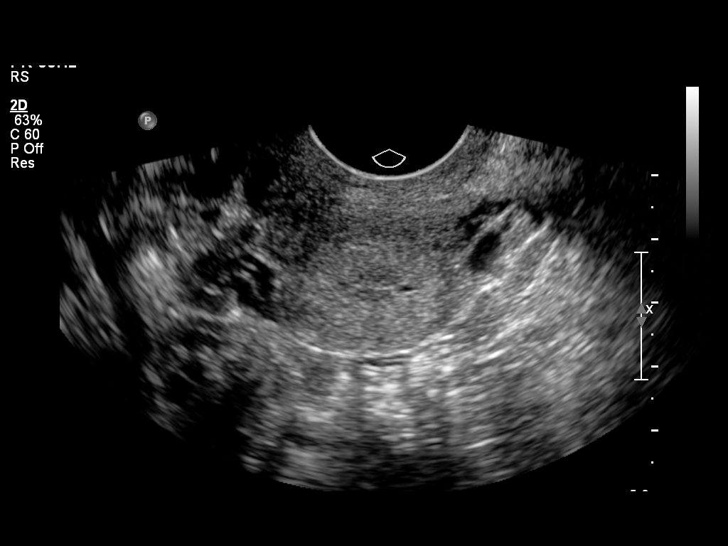
[im 38/46]
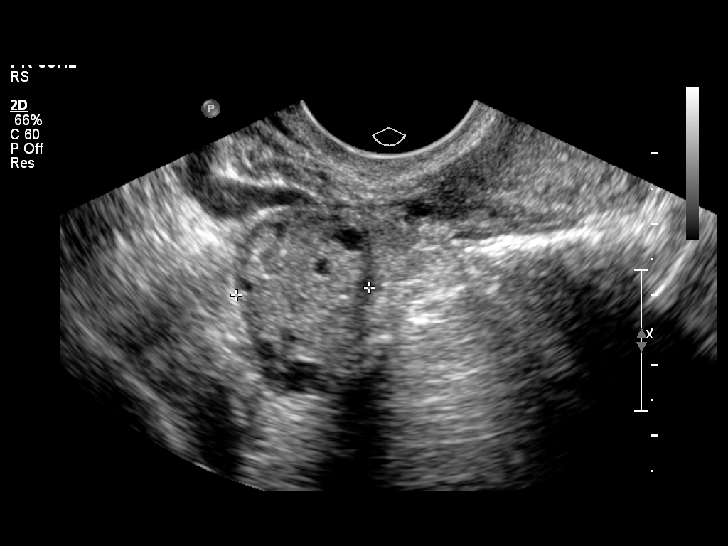
[im 42/46]
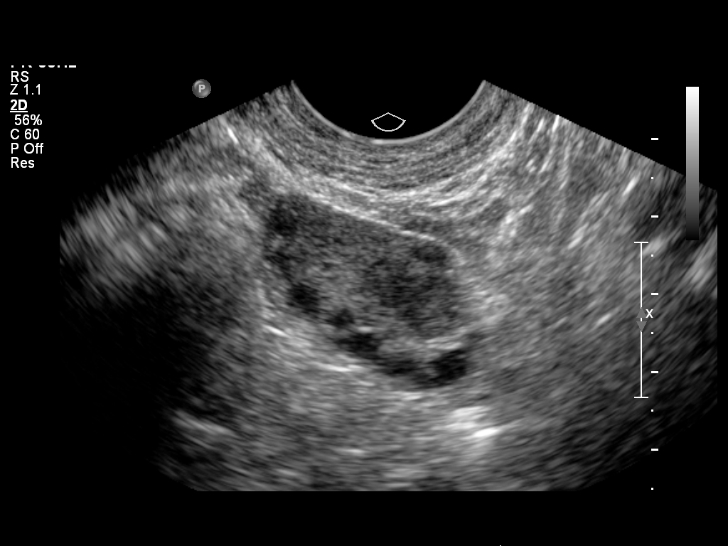
[im 46/46]
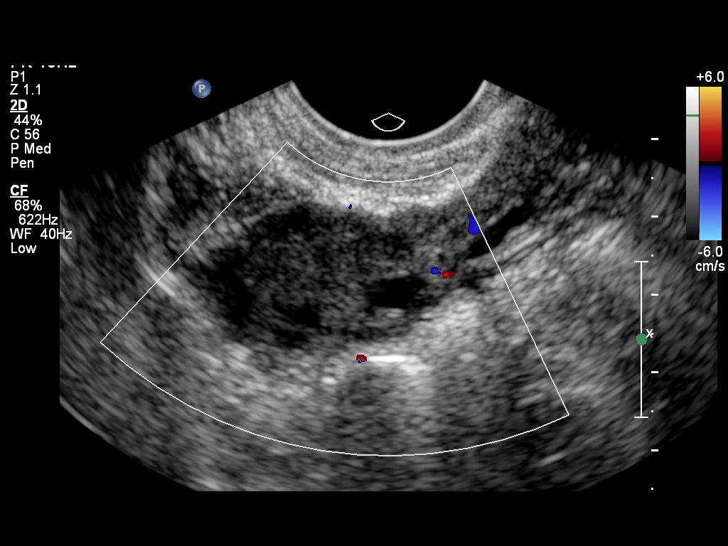

[14 of 25 positions shown; findings below may reference images not displayed]

FINDINGS: Uterus the uterus has a sagittal length of 6.9 cm, an AP depth of
3.3 cm and a transverse width of 4.5 cm.  A homogeneous uterine
myometrium is seen.

Endometrium is thin and echogenic with an AP width of 1.5 mm.  This
would correlate with a postsecretory endometrial stripe and the
patient's given LMP of 12/12/2010.

Right Ovary has a normal appearance measuring 3.7 x 2.7 x 1.9 cm

Left Ovary has a normal appearance measuring 3.3 x 2.1 x 1.9 cm

Other Findings:  No pelvic fluid or separate adnexal masses are
seen.
IMPRESSION: Normal postsecretory pelvic ultrasound.

## 2012-07-17 ENCOUNTER — Encounter (HOSPITAL_COMMUNITY): Payer: Self-pay | Admitting: *Deleted

## 2012-07-17 ENCOUNTER — Emergency Department (HOSPITAL_COMMUNITY)
Admission: EM | Admit: 2012-07-17 | Discharge: 2012-07-17 | Disposition: A | Discharge status: Home / Self Care | Payer: Managed Care, Other (non HMO) | Source: Home / Self Care

## 2012-07-17 DIAGNOSIS — H1031 Unspecified acute conjunctivitis, right eye: Secondary | ICD-10-CM

## 2012-07-17 DIAGNOSIS — H5789 Other specified disorders of eye and adnexa: Secondary | ICD-10-CM

## 2012-07-17 MED ORDER — POLYMYXIN B-TRIMETHOPRIM 10000-0.1 UNIT/ML-% OP SOLN: 1.0000 [drp] | OPHTHALMIC | Status: AC

## 2012-07-17 NOTE — ED Provider Notes (Signed)
Medical screening examination/treatment/procedure(s) were performed by non-physician practitioner and as supervising physician I was immediately available for consultation/collaboration.  Romone Shaff, M.D.   Allison Deshotels C Hend Mccarrell, MD 07/17/12 2135 

## 2012-07-17 NOTE — ED Notes (Signed)
Pt with drainage irritation right eye x 5 days - crusty drainage in am /itching Erin Bullock

## 2012-07-17 NOTE — ED Provider Notes (Signed)
History     CSN: [id number]  Arrival date & time 07/17/12  1236   None     Chief Complaint  Patient presents with  . Conjunctivitis    (Consider location/radiation/quality/duration/timing/severity/associated sxs/prior treatment) Patient is a 26 y.o. female presenting with conjunctivitis. The history is provided by the patient.  Conjunctivitis    patient reports right eye rednessd for four days associated with discharge in the morning.  Has been using OTC redness relief drops with no change in symptoms.  Wears contacts, removed contacts this morning.  Denies previous history of same.  Not diabetic, no previous eye surgery/trauma, denies cataracts or glaucoma.   No floaters or flashing lights No vision loss No blurred vision No eye pain +eyelid itching No tearing No headache/scalp tenderness Past Medical History  Diagnosis Date  . Pregnancy induced hypertension     after first delivery, post partem  . Urinary tract infection   . Chlamydia   . Gonorrhea     Past Surgical History  Procedure Date  . Induced abortion     Family History  Problem Relation Age of Onset  . Diabetes Mother   . Stroke Mother   . Hypertension Mother   . Cancer Mother   . Hypertension Father   . Cancer Maternal Aunt   . Heart disease Maternal Grandmother     History  Substance Use Topics  . Smoking status: Never Smoker   . Smokeless tobacco: Never Used  . Alcohol Use: No     none with preg    OB History    Grav Para Term Preterm Abortions TAB SAB Ect Mult Living   4 2 2  0 2 1 1  0 0 2      Review of Systems  All other systems reviewed and are negative.    Allergies  Review of patient's allergies indicates no known allergies.  Home Medications   Current Outpatient Rx  Name Route Sig Dispense Refill  . PRENATAL PLUS 27-1 MG PO TABS Oral Take 1 tablet by mouth daily.      Marland Kitchen POLYMYXIN B-TRIMETHOPRIM 10000-0.1 UNIT/ML-% OP SOLN Right Eye Place 1 drop into the right eye every  4 (four) hours. 10 mL 0  . VITAMIN D (ERGOCALCIFEROL) 50000 UNITS PO CAPS Oral Take 50,000 Units by mouth.        BP 131/74  Pulse 83  Temp 98.6 F (37 C) (Oral)  Resp 18  SpO2 100%  Breastfeeding? No  Physical Exam  Nursing note and vitals reviewed. Constitutional: She is oriented to person, place, and time. Vital signs are normal. She appears well-developed and well-nourished. She is active and cooperative.  HENT:  Head: Normocephalic.  Right Ear: Hearing, tympanic membrane, external ear and ear canal normal.  Left Ear: Hearing, tympanic membrane, external ear and ear canal normal.  Nose: Nose normal.  Mouth/Throat: Uvula is midline, oropharynx is clear and moist and mucous membranes are normal.  Eyes: EOM are normal. Pupils are equal, round, and reactive to light. Right eye exhibits no discharge and no exudate. Left eye exhibits no discharge and no exudate. Right conjunctiva is injected. Right conjunctiva has no hemorrhage. Left conjunctiva is not injected. Left conjunctiva has no hemorrhage.       Right lower lid edema  Neck: Trachea normal. Neck supple.  Cardiovascular: Normal rate and regular rhythm.   Pulmonary/Chest: Effort normal and breath sounds normal.  Neurological: She is alert and oriented to person, place, and time. No cranial nerve deficit or  sensory deficit.  Skin: Skin is warm and dry.  Psychiatric: She has a normal mood and affect. Her speech is normal and behavior is normal. Judgment and thought content normal. Cognition and memory are normal.    ED Course  Procedures (including critical care time)  Labs Reviewed - No data to display No results found.   1. Conjunctivitis, acute, right eye   2. Eye redness       MDM  Take medication as prescribed, Warm compresses to right eye, hygiene as discussed, do not use same contacts, wear glasses only for one week.          Johnsie Kindred, NP 07/17/12 1531

## 2012-09-27 ENCOUNTER — Encounter (HOSPITAL_BASED_OUTPATIENT_CLINIC_OR_DEPARTMENT_OTHER): Payer: Self-pay | Admitting: *Deleted

## 2012-09-27 ENCOUNTER — Emergency Department (HOSPITAL_BASED_OUTPATIENT_CLINIC_OR_DEPARTMENT_OTHER)
Admission: EM | Admit: 2012-09-27 | Discharge: 2012-09-27 | Disposition: A | Discharge status: Home / Self Care | Payer: Managed Care, Other (non HMO) | Attending: Emergency Medicine | Admitting: Emergency Medicine

## 2012-09-27 DIAGNOSIS — B349 Viral infection, unspecified: Secondary | ICD-10-CM

## 2012-09-27 DIAGNOSIS — J029 Acute pharyngitis, unspecified: Secondary | ICD-10-CM

## 2012-09-27 DIAGNOSIS — B9789 Other viral agents as the cause of diseases classified elsewhere: Secondary | ICD-10-CM | POA: Insufficient documentation

## 2012-09-27 MED ORDER — IBUPROFEN 800 MG PO TABS
800.0000 mg | ORAL_TABLET | Freq: Once | ORAL | Status: AC
  Administered 2012-09-27: 800 mg via ORAL
  Filled 2012-09-27: qty 1

## 2012-09-27 MED ORDER — IBUPROFEN 600 MG PO TABS: 600.0000 mg | ORAL_TABLET | Freq: Four times a day (QID) | ORAL | Status: DC | PRN

## 2012-09-27 NOTE — ED Notes (Signed)
Pt c/o URI symptoms with fever , chills and body aches x 3 days

## 2012-09-27 NOTE — ED Provider Notes (Signed)
History     CSN: [id number]  Arrival date & time 09/27/12  1529   First MD Initiated Contact with Patient 09/27/12 1548      Chief Complaint  Patient presents with  . URI    (Consider location/radiation/quality/duration/timing/severity/associated sxs/prior treatment) HPI Erin Bullock is a 26 y.o. female presenting with 3 days history of subjective fever, chills, myalgias which she says has been getting worse, is associated with cough, some chest pain it is sharp it is only present when she coughs, her cough is nonproductive is been associated also with some mild left ear pain it feels like a pressure. She's not taken anything for her discomfort. She's not tried anything else to help. She denies nausea or vomiting, diaphoresis, chills and denies any exertional chest pain or exertional dyspnea. No rashes, joint aches, headache or neck pain. Patient has 2 children in daycare however they are not ill. No sick contacts.   Past Medical History  Diagnosis Date  . Pregnancy induced hypertension     after first delivery, post partem  . Urinary tract infection   . Chlamydia   . Gonorrhea     Past Surgical History  Procedure Date  . Induced abortion     Family History  Problem Relation Age of Onset  . Diabetes Mother   . Stroke Mother   . Hypertension Mother   . Cancer Mother   . Hypertension Father   . Cancer Maternal Aunt   . Heart disease Maternal Grandmother     History  Substance Use Topics  . Smoking status: Never Smoker   . Smokeless tobacco: Never Used  . Alcohol Use: No     none with preg    OB History    Grav Para Term Preterm Abortions TAB SAB Ect Mult Living   4 2 2  0 2 1 1  0 0 2      Review of Systems At least 10pt or greater review of systems completed and are negative except where specified in the HPI.  Allergies  Review of patient's allergies indicates no known allergies.  Home Medications   Current Outpatient Rx  Name Route Sig Dispense Refill    . IBUPROFEN 600 MG PO TABS Oral Take 1 tablet (600 mg total) by mouth every 6 (six) hours as needed for pain. 30 tablet 0  . PRENATAL PLUS 27-1 MG PO TABS Oral Take 1 tablet by mouth daily.      Marland Kitchen VITAMIN D (ERGOCALCIFEROL) 50000 UNITS PO CAPS Oral Take 50,000 Units by mouth.        BP 126/76  Pulse 108  Temp 99 F (37.2 C) (Oral)  Resp 16  Ht 5\' 1"  (1.549 m)  Wt 160 lb (72.576 kg)  BMI 30.23 kg/m2  SpO2 100%  Breastfeeding? Unknown  Physical Exam  Nursing notes reviewed.  Electronic medical record reviewed. VITAL SIGNS:   Filed Vitals:   09/27/12 1535  BP: 126/76  Pulse: 108  Temp: 99 F (37.2 C)  TempSrc: Oral  Resp: 16  Height: 5\' 1"  (1.549 m)  Weight: 160 lb (72.576 kg)  SpO2: 100%   CONSTITUTIONAL: Awake, oriented, appears non-toxic HENT: Atraumatic, normocephalic, oral mucosa pink and moist. Cobblestoning of bilateral tonsils, mild erythema, airway is patent, mild exudate seen on both tonsils, there is no unilateral swelling. Nares patent without drainage. External ears normal, left TM is mildly erythematous, no fluid no effusion EYES: Conjunctiva clear, EOMI, PERRLA NECK: Trachea midline, non-tender, supple CARDIOVASCULAR: Normal heart rate,  Normal rhythm, No murmurs, rubs, gallops PULMONARY/CHEST: Clear to auscultation, no rhonchi, wheezes, or rales. Symmetrical breath sounds. Non-tender. ABDOMINAL: Non-distended, obese, soft, non-tender - no rebound or guarding.  BS normal. NEUROLOGIC: Non-focal, moving all four extremities, no gross sensory or motor deficits. EXTREMITIES: No clubbing, cyanosis, or edema SKIN: Warm, Dry, No erythema, No rash  ED Course  Procedures (including critical care time)   Labs Reviewed  RAPID STREP SCREEN   No results found.   1. Viral pharyngitis   2. Viral syndrome       MDM  Erin Bullock is a 26 y.o. female presenting with likely viral syndrome viral pharyngitis. No suggestions on the patient's history and physical  exam to suggest meningitis, or pneumonia. Do not think any imaging is required at this time. Patient had extremely low risk for a bacterial pharyngitis - rapid strep was obtained through triage and was negative for strep. Patient does not need any antibiotic therapy, we'll treat her symptoms with some ibuprofen and suggest she get some throat lozenges to soothe her sore throat.    I explained the diagnosis and have given explicit precautions to return to the ER including productive cough, headache, stiff neck, high fever, vomiting, constant chest pain or exertional chest pain or any other new or worsening symptoms. The patient understands and accepts the medical plan as it's been dictated and I have answered their questions. Discharge instructions concerning home care and prescriptions have been given.  The patient is STABLE and is discharged to home in good condition.         Jones Skene, MD 09/27/12 [phone]

## 2013-01-17 ENCOUNTER — Emergency Department (HOSPITAL_BASED_OUTPATIENT_CLINIC_OR_DEPARTMENT_OTHER)
Admission: EM | Admit: 2013-01-17 | Discharge: 2013-01-17 | Disposition: A | Discharge status: Home / Self Care | Payer: Managed Care, Other (non HMO) | Attending: Emergency Medicine | Admitting: Emergency Medicine

## 2013-01-17 ENCOUNTER — Encounter (HOSPITAL_BASED_OUTPATIENT_CLINIC_OR_DEPARTMENT_OTHER): Payer: Self-pay | Admitting: Emergency Medicine

## 2013-01-17 DIAGNOSIS — Z87440 Personal history of urinary (tract) infections: Secondary | ICD-10-CM | POA: Insufficient documentation

## 2013-01-17 DIAGNOSIS — Z8742 Personal history of other diseases of the female genital tract: Secondary | ICD-10-CM | POA: Insufficient documentation

## 2013-01-17 DIAGNOSIS — B86 Scabies: Secondary | ICD-10-CM | POA: Insufficient documentation

## 2013-01-17 DIAGNOSIS — Z8619 Personal history of other infectious and parasitic diseases: Secondary | ICD-10-CM | POA: Insufficient documentation

## 2013-01-17 MED ORDER — EPINEPHRINE HCL 1 MG/ML IJ SOLN
0.3000 mg | Freq: Once | INTRAMUSCULAR | Status: AC
  Administered 2013-01-17: 0.3 mg via INTRAMUSCULAR
  Filled 2013-01-17: qty 1

## 2013-01-17 MED ORDER — PREDNISONE 50 MG PO TABS
60.0000 mg | ORAL_TABLET | Freq: Once | ORAL | Status: AC
  Administered 2013-01-17: 60 mg via ORAL
  Filled 2013-01-17: qty 1

## 2013-01-17 MED ORDER — PERMETHRIN 5 % EX CREA: TOPICAL_CREAM | CUTANEOUS | Status: DC

## 2013-01-17 NOTE — ED Provider Notes (Signed)
History     CSN: [id number]  Arrival date & time 01/17/13  1243   First MD Initiated Contact with Patient 01/17/13 1304      Chief Complaint  Patient presents with  . Rash    (Consider location/radiation/quality/duration/timing/severity/associated sxs/prior treatment) Patient is a 27 y.o. female presenting with rash. The history is provided by the patient. No language interpreter was used.  Rash  This is a new problem. The current episode started 2 days ago. The problem has been gradually worsening. The problem is associated with nothing. There has been no fever. The rash is present on the right arm, right hand, right lower leg, left hand and left lower leg (Abdomen). The pain is at a severity of 0/10. The patient is experiencing no pain. Associated symptoms include itching. She has tried antihistamines for the symptoms. The treatment provided mild relief.    Past Medical History  Diagnosis Date  . Pregnancy induced hypertension     after first delivery, post partem  . Urinary tract infection   . Chlamydia   . Gonorrhea     Past Surgical History  Procedure Date  . Induced abortion     Family History  Problem Relation Age of Onset  . Diabetes Mother   . Stroke Mother   . Hypertension Mother   . Cancer Mother   . Hypertension Father   . Cancer Maternal Aunt   . Heart disease Maternal Grandmother     History  Substance Use Topics  . Smoking status: Never Smoker   . Smokeless tobacco: Never Used  . Alcohol Use: No     Comment: none with preg    OB History    Grav Para Term Preterm Abortions TAB SAB Ect Mult Living   4 2 2  0 2 1 1  0 0 2      Review of Systems  Constitutional: Negative.   HENT: Negative.   Eyes: Negative.   Respiratory: Negative.   Cardiovascular: Negative.   Gastrointestinal: Negative.   Genitourinary: Negative.   Musculoskeletal: Negative.   Skin: Positive for itching and rash.  Neurological: Negative.   Psychiatric/Behavioral:  Negative.     Allergies  Review of patient's allergies indicates no known allergies.  Home Medications   Current Outpatient Rx  Name  Route  Sig  Dispense  Refill  . IBUPROFEN 600 MG PO TABS   Oral   Take 1 tablet (600 mg total) by mouth every 6 (six) hours as needed for pain.   30 tablet   0   . PRENATAL PLUS 27-1 MG PO TABS   Oral   Take 1 tablet by mouth daily.           Marland Kitchen VITAMIN D (ERGOCALCIFEROL) 50000 UNITS PO CAPS   Oral   Take 50,000 Units by mouth.             BP 145/78  Pulse 68  Temp 97.8 F (36.6 C) (Oral)  Resp 18  Ht 5\' 1"  (1.549 m)  Wt 177 lb 12.8 oz (80.65 kg)  BMI 33.60 kg/m2  SpO2 100%  Breastfeeding? No  Physical Exam  Nursing note and vitals reviewed. Constitutional: She is oriented to person, place, and time. She appears well-developed and well-nourished.       In mild distress with itching.  HENT:  Head: Normocephalic and atraumatic.  Right Ear: External ear normal.  Left Ear: External ear normal.  Mouth/Throat: Oropharynx is clear and moist.  Eyes: Conjunctivae normal and EOM  are normal. Pupils are equal, round, and reactive to light.  Neck: Normal range of motion. Neck supple.  Cardiovascular: Normal rate, regular rhythm and normal heart sounds.   Pulmonary/Chest: Effort normal and breath sounds normal.  Abdominal: Soft. Bowel sounds are normal.  Musculoskeletal: Normal range of motion.  Neurological: She is alert and oriented to person, place, and time.       No sensory or motor deficit.  Skin:       She has a red fine papular rash on lower legs, dorsum of hands, abdomen, ? Urticarial.   Psychiatric: She has a normal mood and affect. Her behavior is normal.    ED Course  Procedures (including critical care time)  1:36 PM Pt seen --> physical exam performed. She has taken Benadryl about an hour ago.  Epinephrine and prednisone ordered. Will observe for an hour.  2:29 PM Rash not relieved with epinphrine.  Will Rx for  scabies with Elimite.  1. Scabies         Carleene Cooper III, MD 01/17/13 [id number]

## 2013-01-17 NOTE — ED Notes (Signed)
Patient started with itching and red rash on legs, arms, head, and chest on Monday.  Rash has worsened.  No breathing difficulties or swelling.

## 2013-01-17 NOTE — ED Notes (Signed)
Patient placed on cardiac and O2 sat monitor.

## 2013-09-18 ENCOUNTER — Ambulatory Visit (HOSPITAL_COMMUNITY)
Admission: AD | Admit: 2013-09-18 | Discharge: 2013-09-18 | Disposition: A | Discharge status: Home / Self Care | Payer: Self-pay | Source: Intra-hospital | Attending: Psychiatry | Admitting: Psychiatry

## 2013-09-18 ENCOUNTER — Encounter (HOSPITAL_COMMUNITY): Payer: Self-pay | Admitting: *Deleted

## 2013-09-18 HISTORY — DX: Depression, unspecified: F32.A

## 2013-09-18 HISTORY — DX: Major depressive disorder, single episode, unspecified: F32.9

## 2013-09-18 NOTE — BH Assessment (Signed)
Pt was provided with housing resources.   Glorious Peach, MS, LCASA Assessment Counselor

## 2013-09-18 NOTE — H&P (Signed)
Behavioral Health Medical Screening Exam  Erin Bullock is an 27 y.o. female.  Review of Systems  Constitutional: Negative.  Negative for fever, chills, weight loss, malaise/fatigue and diaphoresis.  HENT: Negative.  Negative for hearing loss, ear pain, nosebleeds, congestion, neck pain, tinnitus and ear discharge.   Eyes: Negative.  Negative for blurred vision, double vision, photophobia, pain, discharge and redness.  Respiratory: Negative.  Negative for cough, hemoptysis, sputum production, shortness of breath and stridor.   Cardiovascular: Negative.  Negative for chest pain, palpitations, orthopnea, claudication and leg swelling.  Gastrointestinal: Negative.  Negative for heartburn, nausea, vomiting, abdominal pain, diarrhea, constipation and blood in stool.  Genitourinary: Negative.  Negative for dysuria, urgency, frequency and hematuria.  Musculoskeletal: Negative.  Negative for myalgias, back pain and joint pain.  Skin: Negative.  Negative for itching and rash.  Neurological: Negative.  Negative for dizziness, tingling, tremors, sensory change, speech change, focal weakness, seizures, weakness and headaches.  Endo/Heme/Allergies: Negative.  Negative for environmental allergies. Does not bruise/bleed easily.  Psychiatric/Behavioral: Positive for depression. Negative for suicidal ideas, hallucinations, memory loss and substance abuse. The patient is nervous/anxious. The patient does not have insomnia.     Physical Exam  Constitutional: She is oriented to person, place, and time. She appears well-developed and well-nourished.  HENT:  Head: Normocephalic and atraumatic.  Right Ear: External ear normal.  Left Ear: External ear normal.  Nose: Nose normal.  Mouth/Throat: Oropharynx is clear and moist.  Eyes: Conjunctivae and EOM are normal. Pupils are equal, round, and reactive to light.  Neck: Normal range of motion. Neck supple.  Cardiovascular: Normal rate, regular rhythm, normal heart  sounds and intact distal pulses.   Respiratory: Effort normal and breath sounds normal.  GI: Soft. Bowel sounds are normal.  Musculoskeletal: Normal range of motion.  Neurological: She is alert and oriented to person, place, and time. She has normal reflexes.  Skin: Skin is warm and dry.    There were no vitals taken for this visit.  Recommendations: Patient presented as a walk in wanting to get help with depression. She was given a list of resources and reported that she felt hopeful about the help received. The patient did not appear to be in any acute distress.  Based on my evaluation the patient does not appear to have an emergency medical condition.  Albertina Leise NP-C 09/18/2013, 5:06 PM
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
## 2013-10-15 ENCOUNTER — Emergency Department (INDEPENDENT_AMBULATORY_CARE_PROVIDER_SITE_OTHER): Payer: Self-pay

## 2013-10-15 ENCOUNTER — Emergency Department (INDEPENDENT_AMBULATORY_CARE_PROVIDER_SITE_OTHER)
Admission: EM | Admit: 2013-10-15 | Discharge: 2013-10-15 | Disposition: A | Discharge status: Home / Self Care | Payer: Self-pay | Source: Home / Self Care | Attending: Emergency Medicine | Admitting: Emergency Medicine

## 2013-10-15 ENCOUNTER — Encounter (HOSPITAL_COMMUNITY): Payer: Self-pay | Admitting: Emergency Medicine

## 2013-10-15 DIAGNOSIS — R1013 Epigastric pain: Secondary | ICD-10-CM

## 2013-10-15 MED ORDER — SUCRALFATE 1 G PO TABS: 1.0000 g | ORAL_TABLET | Freq: Three times a day (TID) | ORAL | Status: DC

## 2013-10-15 MED ORDER — GI COCKTAIL ~~LOC~~
ORAL | Status: AC
  Filled 2013-10-15: qty 30

## 2013-10-15 MED ORDER — OMEPRAZOLE 20 MG PO CPDR: 20.0000 mg | DELAYED_RELEASE_CAPSULE | Freq: Two times a day (BID) | ORAL | Status: DC

## 2013-10-15 MED ORDER — GI COCKTAIL ~~LOC~~
30.0000 mL | Freq: Once | ORAL | Status: AC
  Administered 2013-10-15: 30 mL via ORAL

## 2013-10-15 NOTE — ED Provider Notes (Signed)
Chief Complaint:   Chief Complaint  Patient presents with  . Chest Pain    History of Present Illness:   Erin Bullock is a 27 year old female who has had recurring episodes of sharp, lower sternal and epigastric pain radiating through the back for the past 3 years, ever since the birth of her child. Episodes occur about every 2 weeks and usually last for hours at a time. Current episode came on last night at 11 PM while she was asleep. It woke her up out of sleep. The patient had eaten a meal of corn bread, potato casserole, meatloaf, and fat back meat about 4 hours previously. The pain feels like a sharp pain rated 9/10 in intensity. It's worse with a deep breath and also when she lifts her son. It does not seem to be related to exertion or meals. She's had no associated fever, chills, sweats, shortness of breath, coughing, or wheezing. She denies any palpitations, dizziness, syncope, leg pain, or leg swelling. She denies any nausea, vomiting, anorexia, indigestion, or heartburn. She's had no history of heart problems, blood clots, thrombophlebitis, ulcers, reflux esophagitis, or gallbladder disease.  Review of Systems:  Other than noted above, the patient denies any of the following symptoms. Systemic:  No fever, chills, sweats, or fatigue. ENT:  No nasal congestion, rhinorrhea, or sore throat. Pulmonary:  No cough, wheezing, shortness of breath, sputum production, hemoptysis. Cardiac:  No palpitations, rapid heartbeat, dizziness, presyncope or syncope. GI:  No abdominal pain, heartburn, nausea, or vomiting. Ext:  No leg pain or swelling.  PMFSH:  Past medical history, family history, social history, meds, and allergies were reviewed and updated as needed. Her last menstrual period was in 2012. She has a Mirena IUD. She denies any use of tobacco, drinks socially, and drinks about one Pepsi per day.  Physical Exam:   Vital signs:  BP 120/77  Pulse 65  Temp(Src) 98.9 F (37.2 C) (Oral)  Resp  20  SpO2 99% Gen:  Alert, oriented, in no distress, skin warm and dry. Eye:  PERRL, lids and conjunctivas normal.  Sclera non-icteric. ENT:  Mucous membranes moist, pharynx clear. Neck:  Supple, no adenopathy or tenderness.  No JVD. Lungs:  Clear to auscultation, no wheezes, rales or rhonchi.  No respiratory distress. Heart:  Regular rhythm.  No gallops, murmers, clicks or rubs. Chest:  There is tenderness to palpation over the xiphoid area. Abdomen:  There is tenderness to palpation in the epigastrium and right upper quadrant. No guarding or rebound. Murphy's sign and Murphy's punch are positive.  Bowel sounds normal.  No pulsatile abdominal mass or bruit. Ext:  No edema.  No calf tenderness and Homann's sign negative.  Pulses full and equal. Skin:  Warm and dry.  No rash.  Radiology:  Dg Abd Acute W/chest  10/15/2013   CLINICAL DATA:  Chest and abdominal pain  EXAM: ACUTE ABDOMEN SERIES (ABDOMEN 2 VIEW & CHEST 1 VIEW)  COMPARISON:  None.  FINDINGS: PA chest: Lungs are clear. Heart is upper normal in size with normal pulmonary vascularity. No adenopathy.  Supine and upright abdomen: There is moderate stool in the colon. The bowel gas pattern is normal. No obstruction or free air. There is an intrauterine device in the mid-pelvis. There are no abnormal calcifications.  IMPRESSION: Intrauterine device in mid pelvis. Bowel gas pattern unremarkable. Lungs clear.   Electronically Signed   By: Bretta Bang M.D.   On: 10/15/2013 09:43   I reviewed the images independently  and personally and concur with the radiologist's findings.  EKG:   Date: 10/15/2013  Rate: 54  Rhythm: normal sinus rhythm  QRS Axis: normal  Intervals: normal  ST/T Wave abnormalities: normal  Conduction Disutrbances:none  Narrative Interpretation: Normal sinus rhythm, normal EKG.  Old EKG Reviewed: none available  Course in Urgent Care Center:   The patient was given 30 cc of GI cocktail. Thereafter her pain cleared up  completely and she was pain free.  Assessment:  The encounter diagnosis was Epigastric pain.  The rapid and complete response to GI cocktail suggests an esophageal or stomach etiology of the pain rather than gallstones. She will need a complete GI workup and has been referred to gastroenterology. In the meantime will treat with a reflux diet, Carafate, and omeprazole.  Plan:   1.  Meds:  The following meds were prescribed:   New Prescriptions   OMEPRAZOLE (PRILOSEC) 20 MG CAPSULE    Take 1 capsule (20 mg total) by mouth 2 (two) times daily before a meal.   SUCRALFATE (CARAFATE) 1 G TABLET    Take 1 tablet (1 g total) by mouth 4 (four) times daily -  before meals and at bedtime.    2.  Patient Education/Counseling:  The patient was given appropriate handouts, self care instructions, and instructed in symptomatic relief.  Instructed in diet.  3.  Follow up:  The patient was told to follow up if no better in 3 to 4 days, if becoming worse in any way, and give an an some red flag symptoms such as any difficulty breathing or worsening pain which would prompt immediate return.  Follow up with Dr. Vilinda Boehringer for GI evaluation.     Reuben Likes, MD 10/15/13 1011

## 2013-10-15 NOTE — ED Notes (Signed)
C/o sharp chest pain at end of sternum. Pain started last night around 11 p.m woke from sleep. States this has been a problem since birth of son x 3 yrs. Comes and goes. Radiates around to back. Usually last for two hours. States this time it is lingering.   Pain is severe with deep breathing and lifting son.  Pt has not tried any otc meds for symptoms.

## 2014-01-10 ENCOUNTER — Emergency Department (HOSPITAL_BASED_OUTPATIENT_CLINIC_OR_DEPARTMENT_OTHER)
Admission: EM | Admit: 2014-01-10 | Discharge: 2014-01-10 | Disposition: A | Discharge status: Home / Self Care | Payer: Self-pay | Attending: Emergency Medicine | Admitting: Emergency Medicine

## 2014-01-10 ENCOUNTER — Encounter (HOSPITAL_BASED_OUTPATIENT_CLINIC_OR_DEPARTMENT_OTHER): Payer: Self-pay | Admitting: Emergency Medicine

## 2014-01-10 DIAGNOSIS — J020 Streptococcal pharyngitis: Secondary | ICD-10-CM | POA: Insufficient documentation

## 2014-01-10 DIAGNOSIS — Z87440 Personal history of urinary (tract) infections: Secondary | ICD-10-CM | POA: Insufficient documentation

## 2014-01-10 DIAGNOSIS — Z8659 Personal history of other mental and behavioral disorders: Secondary | ICD-10-CM | POA: Insufficient documentation

## 2014-01-10 DIAGNOSIS — Z8619 Personal history of other infectious and parasitic diseases: Secondary | ICD-10-CM | POA: Insufficient documentation

## 2014-01-10 LAB — RAPID STREP SCREEN (MED CTR MEBANE ONLY): STREPTOCOCCUS, GROUP A SCREEN (DIRECT): POSITIVE — AB

## 2014-01-10 MED ORDER — PENICILLIN V POTASSIUM 500 MG PO TABS: 500.0000 mg | ORAL_TABLET | Freq: Three times a day (TID) | ORAL | Status: DC

## 2014-01-10 NOTE — ED Notes (Signed)
sorethroat x 2 days   Others in house have had strep

## 2014-01-10 NOTE — ED Provider Notes (Signed)
CSN: [id number]     Arrival date & time 01/10/14  1634 History   First MD Initiated Contact with Patient 01/10/14 1651     Chief Complaint  Patient presents with  . Sore Throat   (Consider location/radiation/quality/duration/timing/severity/associated sxs/prior Treatment) Patient is a 28 y.o. female presenting with pharyngitis. The history is provided by the patient.  Sore Throat This is a new problem. The current episode started yesterday. The problem occurs constantly. The problem has been gradually worsening.   Erin Bullock is a 28 y.o. female who presents to the ED with a sore throat that started 2 days ago. She states that the children, their father and the baby sitter have all had strep. She denies cough, or congestion. She does have gland swelling.    Past Medical History  Diagnosis Date  . Pregnancy induced hypertension     after first delivery, post partem  . Urinary tract infection   . Chlamydia   . Gonorrhea   . Depression    Past Surgical History  Procedure Laterality Date  . Induced abortion     Family History  Problem Relation Age of Onset  . Diabetes Mother   . Stroke Mother   . Hypertension Mother   . Cancer Mother   . Hypertension Father   . Cancer Maternal Aunt   . Heart disease Maternal Grandmother    History  Substance Use Topics  . Smoking status: Never Smoker   . Smokeless tobacco: Never Used  . Alcohol Use: No     Comment: none with preg   OB History   Grav Para Term Preterm Abortions TAB SAB Ect Mult Living   4 2 2  0 2 1 1  0 0 2     Review of Systems Negative except as stated in HPI  Allergies  Review of patient's allergies indicates no known allergies.  Home Medications  No current outpatient prescriptions on file. BP 123/76  Pulse 101  Temp(Src) 98.4 F (36.9 C) (Oral)  Resp 16  Ht 5\' 1"  (1.549 m)  Wt 177 lb (80.287 kg)  BMI 33.46 kg/m2  SpO2 99% Physical Exam  Nursing note and vitals reviewed. Constitutional: She is  oriented to person, place, and time. She appears well-developed and well-nourished. No distress.  HENT:  Head: Atraumatic.  Right Ear: Tympanic membrane normal.  Left Ear: Tympanic membrane normal.  Nose: Nose normal.  Mouth/Throat: Uvula is midline and mucous membranes are normal. Posterior oropharyngeal erythema present.  Tonsils enlarged with exudate   Eyes: EOM are normal.  Neck: Neck supple.  Pulmonary/Chest: Effort normal.  Musculoskeletal: Normal range of motion.  Lymphadenopathy:    She has cervical adenopathy.  Neurological: She is alert and oriented to person, place, and time. No cranial nerve deficit.  Skin: Skin is warm and dry.  Psychiatric: She has a normal mood and affect. Her behavior is normal.   Results for orders placed during the hospital encounter of 01/10/14 (from the past 24 hour(s))  RAPID STREP SCREEN     Status: Abnormal   Collection Time    01/10/14  4:54 PM      Result Value Range   Streptococcus, Group A Screen (Direct) POSITIVE (*) NEGATIVE    ED Course  Procedures MDM  28 y.o. female with sore throat and swollen glands x 2 days. Other family members at home with strep. Will treat with antibiotics. She will return as needed.  Discussed with the patient and all questioned fully answered.  Medication List         penicillin v potassium 500 MG tablet  Commonly known as:  VEETID  Take 1 tablet (500 mg total) by mouth 3 (three) times daily.          Janne NapoleonHope M Dorota Heinrichs, [place]NP 01/10/14 [id number]

## 2014-01-11 NOTE — ED Provider Notes (Signed)
Medical screening examination/treatment/procedure(s) were performed by non-physician practitioner and as supervising physician I was immediately available for consultation/collaboration.     Geoffery Lyonsouglas Marce Charlesworth, MD 01/11/14 [id number]

## 2014-02-21 ENCOUNTER — Ambulatory Visit (HOSPITAL_BASED_OUTPATIENT_CLINIC_OR_DEPARTMENT_OTHER): Admit: 2014-02-21 | Payer: Self-pay

## 2014-02-21 ENCOUNTER — Encounter (HOSPITAL_BASED_OUTPATIENT_CLINIC_OR_DEPARTMENT_OTHER): Payer: Self-pay | Admitting: Emergency Medicine

## 2014-02-21 ENCOUNTER — Emergency Department (HOSPITAL_BASED_OUTPATIENT_CLINIC_OR_DEPARTMENT_OTHER)
Admission: EM | Admit: 2014-02-21 | Discharge: 2014-02-21 | Disposition: A | Discharge status: Home / Self Care | Payer: Self-pay | Attending: Emergency Medicine | Admitting: Emergency Medicine

## 2014-02-21 DIAGNOSIS — R109 Unspecified abdominal pain: Secondary | ICD-10-CM

## 2014-02-21 DIAGNOSIS — Z87440 Personal history of urinary (tract) infections: Secondary | ICD-10-CM | POA: Insufficient documentation

## 2014-02-21 DIAGNOSIS — Z8619 Personal history of other infectious and parasitic diseases: Secondary | ICD-10-CM | POA: Insufficient documentation

## 2014-02-21 DIAGNOSIS — R1012 Left upper quadrant pain: Secondary | ICD-10-CM | POA: Insufficient documentation

## 2014-02-21 DIAGNOSIS — R1013 Epigastric pain: Secondary | ICD-10-CM | POA: Insufficient documentation

## 2014-02-21 DIAGNOSIS — Z8719 Personal history of other diseases of the digestive system: Secondary | ICD-10-CM | POA: Insufficient documentation

## 2014-02-21 DIAGNOSIS — R1011 Right upper quadrant pain: Secondary | ICD-10-CM | POA: Insufficient documentation

## 2014-02-21 DIAGNOSIS — Z8659 Personal history of other mental and behavioral disorders: Secondary | ICD-10-CM | POA: Insufficient documentation

## 2014-02-21 DIAGNOSIS — Z8679 Personal history of other diseases of the circulatory system: Secondary | ICD-10-CM | POA: Insufficient documentation

## 2014-02-21 DIAGNOSIS — Z79899 Other long term (current) drug therapy: Secondary | ICD-10-CM | POA: Insufficient documentation

## 2014-02-21 DIAGNOSIS — R61 Generalized hyperhidrosis: Secondary | ICD-10-CM | POA: Insufficient documentation

## 2014-02-21 DIAGNOSIS — R112 Nausea with vomiting, unspecified: Secondary | ICD-10-CM | POA: Insufficient documentation

## 2014-02-21 HISTORY — DX: Gastro-esophageal reflux disease without esophagitis: K21.9

## 2014-02-21 LAB — CBC WITH DIFFERENTIAL/PLATELET
BASOS ABS: 0 10*3/uL (ref 0.0–0.1)
Basophils Relative: 0 % (ref 0–1)
Eosinophils Absolute: 0.1 10*3/uL (ref 0.0–0.7)
Eosinophils Relative: 1 % (ref 0–5)
HEMATOCRIT: 36.9 % (ref 36.0–46.0)
HEMOGLOBIN: 12.2 g/dL (ref 12.0–15.0)
LYMPHS ABS: 3.1 10*3/uL (ref 0.7–4.0)
LYMPHS PCT: 44 % (ref 12–46)
MCH: 30 pg (ref 26.0–34.0)
MCHC: 33.1 g/dL (ref 30.0–36.0)
MCV: 90.9 fL (ref 78.0–100.0)
MONO ABS: 0.4 10*3/uL (ref 0.1–1.0)
MONOS PCT: 6 % (ref 3–12)
NEUTROS ABS: 3.4 10*3/uL (ref 1.7–7.7)
Neutrophils Relative %: 49 % (ref 43–77)
Platelets: 214 10*3/uL (ref 150–400)
RBC: 4.06 MIL/uL (ref 3.87–5.11)
RDW: 13 % (ref 11.5–15.5)
WBC: 7 10*3/uL (ref 4.0–10.5)

## 2014-02-21 LAB — COMPREHENSIVE METABOLIC PANEL
ALT: 12 U/L (ref 0–35)
AST: 15 U/L (ref 0–37)
Albumin: 4.3 g/dL (ref 3.5–5.2)
Alkaline Phosphatase: 69 U/L (ref 39–117)
BILIRUBIN TOTAL: 0.3 mg/dL (ref 0.3–1.2)
BUN: 12 mg/dL (ref 6–23)
CHLORIDE: 101 meq/L (ref 96–112)
CO2: 28 mEq/L (ref 19–32)
Calcium: 9.5 mg/dL (ref 8.4–10.5)
Creatinine, Ser: 0.7 mg/dL (ref 0.50–1.10)
GFR calc Af Amer: >90 mL/min (ref >90)
GFR calc non Af Amer: >90 mL/min (ref >90)
GLUCOSE: 107 mg/dL — AB (ref 70–99)
Potassium: 3.5 mEq/L — ABNORMAL LOW (ref 3.7–5.3)
Sodium: 141 mEq/L (ref 137–147)
Total Protein: 7.6 g/dL (ref 6.0–8.3)

## 2014-02-21 LAB — LIPASE, BLOOD: Lipase: 31 U/L (ref 11–59)

## 2014-02-21 MED ORDER — PANTOPRAZOLE SODIUM 40 MG IV SOLR
40.0000 mg | Freq: Once | INTRAVENOUS | Status: AC
  Administered 2014-02-21: 40 mg via INTRAVENOUS
  Filled 2014-02-21: qty 40

## 2014-02-21 MED ORDER — SODIUM CHLORIDE 0.9 % IV SOLN
1000.0000 mL | INTRAVENOUS | Status: DC
Start: 2014-02-21 — End: 2014-02-21
  Administered 2014-02-21: 1000 mL via INTRAVENOUS

## 2014-02-21 MED ORDER — GI COCKTAIL ~~LOC~~
30.0000 mL | Freq: Once | ORAL | Status: AC
  Administered 2014-02-21: 30 mL via ORAL
  Filled 2014-02-21: qty 30

## 2014-02-21 MED ORDER — KETOROLAC TROMETHAMINE 30 MG/ML IJ SOLN
30.0000 mg | Freq: Once | INTRAMUSCULAR | Status: AC
  Administered 2014-02-21: 30 mg via INTRAVENOUS
  Filled 2014-02-21: qty 1

## 2014-02-21 MED ORDER — OXYCODONE-ACETAMINOPHEN 5-325 MG PO TABS: 1.0000 | ORAL_TABLET | ORAL | Status: DC | PRN

## 2014-02-21 MED ORDER — ONDANSETRON HCL 4 MG/2ML IJ SOLN
4.0000 mg | Freq: Once | INTRAMUSCULAR | Status: AC
  Administered 2014-02-21: 4 mg via INTRAVENOUS
  Filled 2014-02-21: qty 2

## 2014-02-21 MED ORDER — SODIUM CHLORIDE 0.9 % IV SOLN
1000.0000 mL | Freq: Once | INTRAVENOUS | Status: AC
  Administered 2014-02-21: 1000 mL via INTRAVENOUS

## 2014-02-21 NOTE — ED Notes (Signed)
MD at bedside. 

## 2014-02-21 NOTE — ED Provider Notes (Signed)
CSN: [id number]     Arrival date & time 02/21/14  0041 History   First MD Initiated Contact with Patient 02/21/14 0054     Chief Complaint  Patient presents with  . Chest Pain     (Consider location/radiation/quality/duration/timing/severity/associated sxs/prior Treatment) Patient is a 28 y.o. female presenting with chest pain. The history is provided by the patient.  Chest Pain She had onset about 11 PM of severe epigastric pain with radiation to the back. Pain is sharp and she rates it 10/10. Nothing makes it better nothing makes it worse. She has nausea and did vomit once with no change in her pain. She tried taking TUMS but vomited after taking the TUMS. There is no dyspnea. She did have slight diaphoresis when she was vomiting. She had eaten some shrimp earlier in the evening. She has had similar pains in the past although usually not this severe. She remembers that it was especially bad after eating pizza on Monday occasion. She's been having pains like this for about the last 9 months. Her youngest child is 556 years old.  Past Medical History  Diagnosis Date  . Pregnancy induced hypertension     after first delivery, post partem  . Urinary tract infection   . Chlamydia   . Gonorrhea   . Depression   . Acid reflux    Past Surgical History  Procedure Laterality Date  . Induced abortion     Family History  Problem Relation Age of Onset  . Diabetes Mother   . Stroke Mother   . Hypertension Mother   . Cancer Mother   . Hypertension Father   . Cancer Maternal Aunt   . Heart disease Maternal Grandmother    History  Substance Use Topics  . Smoking status: Never Smoker   . Smokeless tobacco: Never Used  . Alcohol Use: No     Comment: none with preg   OB History   Grav Para Term Preterm Abortions TAB SAB Ect Mult Living   4 2 2  0 2 1 1  0 0 2     Review of Systems  Cardiovascular: Positive for chest pain.  All other systems reviewed and are  negative.      Allergies  Review of patient's allergies indicates no known allergies.  Home Medications   Current Outpatient Rx  Name  Route  Sig  Dispense  Refill  . penicillin v potassium (VEETID) 500 MG tablet   Oral   Take 1 tablet (500 mg total) by mouth 3 (three) times daily.   30 tablet   0    BP 142/89  Pulse 62  Temp(Src) 98.5 F (36.9 C) (Oral)  Resp 20  Ht 5\' 1"  (1.549 m)  Wt 170 lb (77.111 kg)  BMI 32.14 kg/m2  SpO2 100% Physical Exam  Nursing note and vitals reviewed.  28 year old female, who appears uncomfortable, but is in no acute distress. Vital signs are significant for borderline hypertension with blood pressure 142/89. Oxygen saturation is 100%, which is normal. Head is normocephalic and atraumatic. PERRLA, EOMI. Oropharynx is clear. Neck is nontender and supple without adenopathy or JVD. Back is nontender and there is no CVA tenderness. Lungs are clear without rales, wheezes, or rhonchi. Chest is nontender. Heart has regular rate and rhythm without murmur. Abdomen is soft, flat, with moderate epigastric tenderness, mild left upper quadrant tenderness, marked right upper quadrant tenderness with positive Murphy sign. There are no masses or hepatosplenomegaly and peristalsis is hypoactive. Extremities  have no cyanosis or edema, full range of motion is present. Skin is warm and dry without rash. Neurologic: Mental status is normal, cranial nerves are intact, there are no motor or sensory deficits.  ED Course  Procedures (including critical care time) Labs Review Results for orders placed during the hospital encounter of 02/21/14  CBC WITH DIFFERENTIAL      Result Value Ref Range   WBC 7.0  4.0 - 10.5 K/uL   RBC 4.06  3.87 - 5.11 MIL/uL   Hemoglobin 12.2  12.0 - 15.0 g/dL   HCT 16.1  09.6 - 04.5 %   MCV 90.9  78.0 - 100.0 fL   MCH 30.0  26.0 - 34.0 pg   MCHC 33.1  30.0 - 36.0 g/dL   RDW 40.9  81.1 - 91.4 %   Platelets 214  150 - 400 K/uL    Neutrophils Relative % 49  43 - 77 %   Neutro Abs 3.4  1.7 - 7.7 K/uL   Lymphocytes Relative 44  12 - 46 %   Lymphs Abs 3.1  0.7 - 4.0 K/uL   Monocytes Relative 6  3 - 12 %   Monocytes Absolute 0.4  0.1 - 1.0 K/uL   Eosinophils Relative 1  0 - 5 %   Eosinophils Absolute 0.1  0.0 - 0.7 K/uL   Basophils Relative 0  0 - 1 %   Basophils Absolute 0.0  0.0 - 0.1 K/uL  COMPREHENSIVE METABOLIC PANEL      Result Value Ref Range   Sodium 141  137 - 147 mEq/L   Potassium 3.5 (*) 3.7 - 5.3 mEq/L   Chloride 101  96 - 112 mEq/L   CO2 28  19 - 32 mEq/L   Glucose, Bld 107 (*) 70 - 99 mg/dL   BUN 12  6 - 23 mg/dL   Creatinine, Ser 7.82  0.50 - 1.10 mg/dL   Calcium 9.5  8.4 - 95.6 mg/dL   Total Protein 7.6  6.0 - 8.3 g/dL   Albumin 4.3  3.5 - 5.2 g/dL   AST 15  0 - 37 U/L   ALT 12  0 - 35 U/L   Alkaline Phosphatase 69  39 - 117 U/L   Total Bilirubin 0.3  0.3 - 1.2 mg/dL   GFR calc non Af Amer >90  >90 mL/min   GFR calc Af Amer >90  >90 mL/min  LIPASE, BLOOD      Result Value Ref Range   Lipase 31  11 - 59 U/L   EKG Interpretation    Date/Time:  Thursday February 21 2014 00:49:39 EST Ventricular Rate:  68 PR Interval:  122 QRS Duration: 80 QT Interval:  380 QTC Calculation: 404 R Axis:   29 Text Interpretation:  Normal sinus rhythm with sinus arrhythmia Nonspecific T wave abnormality Abnormal ECG No old tracing to compare Confirmed by [facility]  MD, Elia Nunley ([id number]) on 02/21/2014 12:58:20 AM          ECG from 10/15/2013 is not in MUSE - today's ECG is unchanged. MDM   Final diagnoses:  Abdominal pain    Abdominal pain suspicious for biliary colic. Old records are reviewed and she had a similar presentation in October at which time symptoms were completely resolved with GI cocktail. She was prescribed omeprazole and sucralfate but never got the prescriptions filled. She was referred to gastroenterology but never followed up with the gastroenterologist. She'll be given pantoprazole and a GI  cocktail as  well as ondansetron. Limited bedside ultrasound was attempted but I was unable to visualize the gallbladder suggesting that it is contracted.  She got good relief of pain with the above noted treatment, but had persistent back pain. This resolved after a dose of Ketorolac. She is discharged with a prescription for oxycodone-acetaminophen, and an abdominal ultrasound has been ordered. She is advised to take omeprazole on a daily basis.  Dione Booze, MD 02/21/14 [phone]

## 2014-02-21 NOTE — Discharge Instructions (Signed)
I suspect your pain is from gallstones, and have ordered an ultrasound to see if you have gallstones. If you do, you will need to have surgery to remove your gallbladder - otherwise, you will continue to have episodes of pain like tonight.  Take Omeprazole (Prilosec OTC) once a day.   Abdominal Pain, Adult Many things can cause abdominal pain. Usually, abdominal pain is not caused by a disease and will improve without treatment. It can often be observed and treated at home. Your health care provider will do a physical exam and possibly order blood tests and X-rays to help determine the seriousness of your pain. However, in many cases, more time must pass before a clear cause of the pain can be found. Before that point, your health care provider may not know if you need more testing or further treatment. HOME CARE INSTRUCTIONS  Monitor your abdominal pain for any changes. The following actions may help to alleviate any discomfort you are experiencing:  Only take over-the-counter or prescription medicines as directed by your health care provider.  Do not take laxatives unless directed to do so by your health care provider.  Try a clear liquid diet (broth, tea, or water) as directed by your health care provider. Slowly move to a bland diet as tolerated. SEEK MEDICAL CARE IF:  You have unexplained abdominal pain.  You have abdominal pain associated with nausea or diarrhea.  You have pain when you urinate or have a bowel movement.  You experience abdominal pain that wakes you in the night.  You have abdominal pain that is worsened or improved by eating food.  You have abdominal pain that is worsened with eating fatty foods. SEEK IMMEDIATE MEDICAL CARE IF:   Your pain does not go away within 2 hours.  You have a fever.  You keep throwing up (vomiting).  Your pain is felt only in portions of the abdomen, such as the right side or the left lower portion of the abdomen.  You pass bloody  or black tarry stools. MAKE SURE YOU:  Understand these instructions.   Will watch your condition.   Will get help right away if you are not doing well or get worse.  Document Released: 09/22/2005 Document Revised: 10/03/2013 Document Reviewed: 08/22/2013 [facility] Patient Information 2014 [place], [place].  Acetaminophen; Oxycodone tablets What is this medicine? ACETAMINOPHEN; OXYCODONE (a set a MEE noe fen; ox i KOE done) is a pain reliever. It is used to treat mild to moderate pain. This medicine may be used for other purposes; ask your health care provider or pharmacist if you have questions. COMMON BRAND NAME(S): Endocet, Magnacet, Narvox, Percocet, Perloxx, Primalev, Primlev, Roxicet, Xolox What should I tell my health care provider before I take this medicine? They need to know if you have any of these conditions: -brain tumor -Crohn's disease, inflammatory bowel disease, or ulcerative colitis -drug abuse or addiction -head injury -heart or circulation problems -if you often drink alcohol -kidney disease or problems going to the bathroom -liver disease -lung disease, asthma, or breathing problems -an unusual or allergic reaction to acetaminophen, oxycodone, other opioid analgesics, other medicines, foods, dyes, or preservatives -pregnant or trying to get pregnant -breast-feeding How should I use this medicine? Take this medicine by mouth with a full glass of water. Follow the directions on the prescription label. Take your medicine at regular intervals. Do not take your medicine more often than directed. Talk to your pediatrician regarding the use of this medicine in children. Special  care may be needed. Patients over 108 years old may have a stronger reaction and need a smaller dose. Overdosage: If you think you have taken too much of this medicine contact a poison control center or emergency room at once. NOTE: This medicine is only for you. Do not share this medicine with  others. What if I miss a dose? If you miss a dose, take it as soon as you can. If it is almost time for your next dose, take only that dose. Do not take double or extra doses. What may interact with this medicine? -alcohol -antihistamines -barbiturates like amobarbital, butalbital, butabarbital, methohexital, pentobarbital, phenobarbital, thiopental, and secobarbital -benztropine -drugs for bladder problems like solifenacin, trospium, oxybutynin, tolterodine, hyoscyamine, and methscopolamine -drugs for breathing problems like ipratropium and tiotropium -drugs for certain stomach or intestine problems like propantheline, homatropine methylbromide, glycopyrrolate, atropine, belladonna, and dicyclomine -general anesthetics like etomidate, ketamine, nitrous oxide, propofol, desflurane, enflurane, halothane, isoflurane, and sevoflurane -medicines for depression, anxiety, or psychotic disturbances -medicines for sleep -muscle relaxants -naltrexone -narcotic medicines (opiates) for pain -phenothiazines like perphenazine, thioridazine, chlorpromazine, mesoridazine, fluphenazine, prochlorperazine, promazine, and trifluoperazine -scopolamine -tramadol -trihexyphenidyl This list may not describe all possible interactions. Give your health care provider a list of all the medicines, herbs, non-prescription drugs, or dietary supplements you use. Also tell them if you smoke, drink alcohol, or use illegal drugs. Some items may interact with your medicine. What should I watch for while using this medicine? Tell your doctor or health care professional if your pain does not go away, if it gets worse, or if you have new or a different type of pain. You may develop tolerance to the medicine. Tolerance means that you will need a higher dose of the medication for pain relief. Tolerance is normal and is expected if you take this medicine for a long time. Do not suddenly stop taking your medicine because you may  develop a severe reaction. Your body becomes used to the medicine. This does NOT mean you are addicted. Addiction is a behavior related to getting and using a drug for a non-medical reason. If you have pain, you have a medical reason to take pain medicine. Your doctor will tell you how much medicine to take. If your doctor wants you to stop the medicine, the dose will be slowly lowered over time to avoid any side effects. You may get drowsy or dizzy. Do not drive, use machinery, or do anything that needs mental alertness until you know how this medicine affects you. Do not stand or sit up quickly, especially if you are an older patient. This reduces the risk of dizzy or fainting spells. Alcohol may interfere with the effect of this medicine. Avoid alcoholic drinks. There are different types of narcotic medicines (opiates) for pain. If you take more than one type at the same time, you may have more side effects. Give your health care provider a list of all medicines you use. Your doctor will tell you how much medicine to take. Do not take more medicine than directed. Call emergency for help if you have problems breathing. The medicine will cause constipation. Try to have a bowel movement at least every 2 to 3 days. If you do not have a bowel movement for 3 days, call your doctor or health care professional. Do not take Tylenol (acetaminophen) or medicines that have acetaminophen with this medicine. Too much acetaminophen can be very dangerous. Many nonprescription medicines contain acetaminophen. Always read the labels carefully to avoid  taking more acetaminophen. What side effects may I notice from receiving this medicine? Side effects that you should report to your doctor or health care professional as soon as possible: -allergic reactions like skin rash, itching or hives, swelling of the face, lips, or tongue -breathing difficulties, wheezing -confusion -light headedness or fainting spells -severe  stomach pain -unusually weak or tired -yellowing of the skin or the whites of the eyes  Side effects that usually do not require medical attention (report to your doctor or health care professional if they continue or are bothersome): -dizziness -drowsiness -nausea -vomiting This list may not describe all possible side effects. Call your doctor for medical advice about side effects. You may report side effects to FDA at 1-800-FDA-1088. Where should I keep my medicine? Keep out of the reach of children. This medicine can be abused. Keep your medicine in a safe place to protect it from theft. Do not share this medicine with anyone. Selling or giving away this medicine is dangerous and against the law. Store at room temperature between 20 and 25 degrees C (68 and 77 degrees F). Keep container tightly closed. Protect from light. This medicine may cause accidental overdose and death if it is taken by other adults, children, or pets. Flush any unused medicine down the toilet to reduce the chance of harm. Do not use the medicine after the expiration date. NOTE: This sheet is a summary. It may not cover all possible information. If you have questions about this medicine, talk to your doctor, pharmacist, or health care provider.  2014, Elsevier/Gold Standard. (2013-08-06 13:17:35)   Emergency Department Resource Guide 1) Find a Doctor and Pay Out of Pocket Although you won't have to find out who is covered by your insurance plan, it is a good idea to ask around and get recommendations. You will then need to call the office and see if the doctor you have chosen will accept you as a new patient and what types of options they offer for patients who are self-pay. Some doctors offer discounts or will set up payment plans for their patients who do not have insurance, but you will need to ask so you aren't surprised when you get to your appointment.  2) Contact Your Local Health Department Not all health  departments have doctors that can see patients for sick visits, but many do, so it is worth a call to see if yours does. If you don't know where your local health department is, you can check in your phone book. The CDC also has a tool to help you locate your state's health department, and many state websites also have listings of all of their local health departments.  3) Find a Walk-in Clinic If your illness is not likely to be very severe or complicated, you may want to try a walk in clinic. These are popping up all over the country in pharmacies, drugstores, and shopping centers. They're usually staffed by nurse practitioners or physician assistants that have been trained to treat common illnesses and complaints. They're usually fairly quick and inexpensive. However, if you have serious medical issues or chronic medical problems, these are probably not your best option.  No Primary Care Doctor: - Call Health Connect at  [phone] - they can help you locate a primary care doctor that  accepts your insurance, provides certain services, etc. - Physician Referral Service- [phone]  Chronic Pain Problems: Organization         Address  Phone  Notes  [facility]  [phone] Patients need to be referred by their primary care doctor.   Medication Assistance: Organization         Address  Phone   Notes  [facility] [address] [place], [place] [address] [phone] --Must be a resident of [place] [facility] -- Must have NO insurance coverage whatsoever (no Medicaid/ Medicare, etc.) -- The pt. MUST have a primary care doctor that directs their care regularly and follows them in the community   MedAssist  [phone]   [employer]  [phone]    Agencies that provide inexpensive medical care: Organization         Address  Phone   Notes  [facility]  [phone]   [facility]     [phone]   [facility] [address] [place], [place] [address] [phone]   [facility] [address], [place] [phone]   Planned Parenthood    [phone]) [phone]   [facility]    [phone]   [facility]  [address], [place] Phone:  [phone], Fax:  [phone] Hours of Operation:  9 am - 6 pm, M-F.  Also accepts Medicaid/Medicare and self-pay.  [facility]  [address], [place] Phone: [phone], Fax: [phone]. Hours of Operation:  8:30 am - 5:30 pm, M-F.  Also accepts Medicaid and self-pay.  [facility] [place] [address], [place] Phone: [phone]   [facility] [address], [place], [place] [phone], Ext. [phone] Mondays & Thursdays: 7-9 AM.  First 15 patients are seen on a first come, first serve basis.    Medicaid-accepting [facility] [place] Providers:  Organization         Address  Phone   Notes  [facility] [address], [place] [phone] Also accepts self-pay patients.  [facility] [address], [place]  [phone]   [facility] [address], [place] [phone]   [facility] [address] [place] [address], [place] [phone]) [phone]   Renaye RakersVeita Bland [address], [place]   [phone] Only accepts [place] Access [place] patients after they have their name applied to their card.   Self-Pay (no insurance) in [facility] [place]:  Organization         Address  Phone   Notes  Sickle Cell Patients, [facility] [address], [place] [phone]) [phone]   [facility] [address], [place] [phone]   [facility] [place]  [address], [place] [phone]     [facility]/Dr. Osei-Bonsu  [address] [place] [address], [place] or [id number], [place] [phone] Phone number for both [place] and [place] locations is the same.  [facility] [address], [place] [phone]   [facility] [place] [address] [place] [address], [place] or [address] [phone]   [facility] [address], [place] [phone], phone; [phone], fax Sees patients 1st and 3rd Saturday of every month.  Must not qualify for public or private  insurance (i.e. Medicaid, Medicare, [place] Health Choice, Veterans' Benefits)  Household income should be no more than 200% of the poverty level The clinic cannot treat you if you are pregnant or think you are pregnant  Sexually transmitted diseases are not treated at the clinic.    Dental Care: Organization         Address  Phone  Notes  [facility] Department of [facility] [address] [place], [place] [phone] Accepts children up to age 83 who are enrolled in [place] or [place] Health Choice; pregnant women with a Medicaid card; and children who have applied for Medicaid or [place] Health Choice, but were declined, whose parents can pay a reduced fee at time of service.  [facility] of [facility]  [address], [place] [phone] Accepts children up to age 36 who are enrolled in [place] or [place] Health Choice; pregnant women with a Medicaid card; and children who have applied for Medicaid or [place] Health Choice, but were declined, whose parents can pay a reduced fee at time of service.  [facility]  [address] [place], [place] [phone] Patients are seen by appointment only. Walk-ins are not accepted. [facility] will see patients 27 years of age and older. Monday - Tuesday (8am-5pm) Most Wednesdays (8:30-5pm) $30 per visit,  cash only  [facility] Access PRO[facility]  [address], [facility] [phone] Patients are seen by appointment only. Walk-ins are not accepted. [facility] will see patients 21 years of age and older. One Wednesday Evening (Monthly: Volunteer Based).  $30 per visit, cash only  [employer]  [phone] for adults; Children under age 21, call Graduate Pediatric Dentistry at [phone]. Children aged 85-14, please call [phone] to request a pediatric application.  Dental services are provided in all areas of dental care including fillings, crowns and bridges, complete and partial dentures, implants, gum treatment, root canals, and extractions. Preventive care is also provided. Treatment is provided to both adults and children. Patients are selected via a lottery and there is often a waiting list.   [facility] [address], [place]  [phone] [email or web address]   [facility] [address] [place], [place] [phone], Ext. [phone] Second and Fourth Thursday of each month, opens at 6:30 AM; Clinic ends at 9 AM.  Patients are seen on a first-come first-served basis, and a limited number are seen during each clinic.   [facility]  [address] [place], [place] [phone]   Eligibility Requirements You must have lived in [place], [place], or [place] counties for at least the last three months.   You cannot be eligible for state or federal sponsored [place], including [employer], [place], or [employer].   You generally cannot be eligible for healthcare insurance through your employer.    How to apply: Eligibility screenings are held every Tuesday and Wednesday afternoon from 1:00 pm until 4:00 pm. You do not need an appointment for the interview!  [facility] [address], [place], [place] [phone]   [facility] Health Department   [phone]   [facility] Department  [phone]   [facility] Health Department  [phone]    Behavioral Health Resources in the Community: Intensive Outpatient Programs Organization         Address  Phone  Notes  [employer] [address] [place] [address] [place], [place] [phone]   [facility] [address] [place], [place] [phone]   [facility] [address] [place], [place]  [phone]   [facility] [address],  [place], [place] [phone] or [phone]   Substance Abuse Resources Organization         Address  Phone  Notes  Alcohol [facility] Drug Services  [phone]   [facility]  [phone]   [facility]  [phone]   Floydene Flock  [phone]   Residential & Outpatient Substance Abuse Program  [phone]   Psychological Services Organization         Address  Phone  Notes  [facility] Behavioral Health  [phone]   [facility]  [phone]   [facility] Mental Health [address], [place] [phone] or [phone]    Mobile Crisis Teams Organization         Address  Phone  Notes  [facility], Mobile Crisis Care Unit  [phone]   [facility]  [address] [place], [place] [phone]   Doristine Locks [address] [place] [phone]    Self-Help/Support Groups Organization         Address  Phone             Notes  [facility] [place] - variety of support groups  [phone]- [phone] Call for more information  Narcotics Anonymous (NA), [facility] [address], [employer] [place]  2 meetings at this location   Statistician         Address  Phone  Notes  [facility] [address],    [place]  [phone]   [facility]  [address] [place] [address]242, [place], [place] [phone]    [facility] [address] [place], [place] [phone] Admissions: 8am-3pm M-F  [facility] [address]    [place], [place] [phone]   [facility] [address] [place], [place], [place] [phone]   [facility] [address],  [place], [place] [phone]   [facility] - Intensive Outpatient [address], [place], [place] [phone]   [facility] ([facility]iction [facility] [address]  [place], [place] [phone] or [phone]   [facility] ([facility]) [address] [place], [place] [phone] Accepts Medicaid  [facility] [place] [address]  [place] [phone] Substance Abuse/Addiction Treatment   [facility] Organization         Address  Phone  Notes  [facility]  [phone]   Angie Fava, PhD [address] [place]vin [place], [place]   [phone] or [phone]   [facility]   [address] [place], [place] [phone]   [facility] [address] [place], [place] [phone] Insurance/Medicaid/sponsorship through [facility] Families [address]                                    [place], [place] [phone] Therapy/tele-psych/case  [facility] [address], [place] [phone]    Dr. Lolly Mustache  [phone]   [facility]  [employer] [facility] Dept.  1) [address], [place] 2) [address], [place] 3)  [address], [place] [phone]  [phone]   [facility] Child Abuse Hotline [phone]) [phone] or [phone] (After Hours)

## 2014-02-21 NOTE — ED Notes (Signed)
C/o centralized chest pain that radiates to back, pt vomited x 1 after taking tums. Pt states has hx of acid reflux.

## 2014-02-21 NOTE — ED Notes (Signed)
MD at bedside performing bedside US.

## 2014-08-11 ENCOUNTER — Encounter (HOSPITAL_BASED_OUTPATIENT_CLINIC_OR_DEPARTMENT_OTHER): Payer: Self-pay | Admitting: Emergency Medicine

## 2014-08-11 ENCOUNTER — Emergency Department (HOSPITAL_BASED_OUTPATIENT_CLINIC_OR_DEPARTMENT_OTHER)
Admission: EM | Admit: 2014-08-11 | Discharge: 2014-08-11 | Disposition: A | Discharge status: Home / Self Care | Payer: 59 | Attending: Emergency Medicine | Admitting: Emergency Medicine

## 2014-08-11 DIAGNOSIS — K219 Gastro-esophageal reflux disease without esophagitis: Secondary | ICD-10-CM | POA: Insufficient documentation

## 2014-08-11 DIAGNOSIS — Z8659 Personal history of other mental and behavioral disorders: Secondary | ICD-10-CM | POA: Insufficient documentation

## 2014-08-11 DIAGNOSIS — Z792 Long term (current) use of antibiotics: Secondary | ICD-10-CM | POA: Insufficient documentation

## 2014-08-11 DIAGNOSIS — R112 Nausea with vomiting, unspecified: Secondary | ICD-10-CM | POA: Insufficient documentation

## 2014-08-11 DIAGNOSIS — Z3202 Encounter for pregnancy test, result negative: Secondary | ICD-10-CM | POA: Diagnosis not present

## 2014-08-11 DIAGNOSIS — Z8619 Personal history of other infectious and parasitic diseases: Secondary | ICD-10-CM | POA: Insufficient documentation

## 2014-08-11 DIAGNOSIS — R1013 Epigastric pain: Secondary | ICD-10-CM | POA: Diagnosis present

## 2014-08-11 DIAGNOSIS — Z87448 Personal history of other diseases of urinary system: Secondary | ICD-10-CM | POA: Insufficient documentation

## 2014-08-11 LAB — CBC WITH DIFFERENTIAL/PLATELET
Basophils Absolute: 0 10*3/uL (ref 0.0–0.1)
Basophils Relative: 0 % (ref 0–1)
EOS ABS: 0 10*3/uL (ref 0.0–0.7)
EOS PCT: 0 % (ref 0–5)
HCT: 35.6 % — ABNORMAL LOW (ref 36.0–46.0)
Hemoglobin: 11.8 g/dL — ABNORMAL LOW (ref 12.0–15.0)
Lymphocytes Relative: 28 % (ref 12–46)
Lymphs Abs: 2.3 10*3/uL (ref 0.7–4.0)
MCH: 30.1 pg (ref 26.0–34.0)
MCHC: 33.1 g/dL (ref 30.0–36.0)
MCV: 90.8 fL (ref 78.0–100.0)
Monocytes Absolute: 0.4 10*3/uL (ref 0.1–1.0)
Monocytes Relative: 5 % (ref 3–12)
NEUTROS PCT: 67 % (ref 43–77)
Neutro Abs: 5.4 10*3/uL (ref 1.7–7.7)
Platelets: 245 10*3/uL (ref 150–400)
RBC: 3.92 MIL/uL (ref 3.87–5.11)
RDW: 13 % (ref 11.5–15.5)
WBC: 8.1 10*3/uL (ref 4.0–10.5)

## 2014-08-11 LAB — COMPREHENSIVE METABOLIC PANEL
ALT: 13 U/L (ref 0–35)
AST: 15 U/L (ref 0–37)
Albumin: 4.4 g/dL (ref 3.5–5.2)
Alkaline Phosphatase: 70 U/L (ref 39–117)
Anion gap: 13 (ref 5–15)
BUN: 11 mg/dL (ref 6–23)
CALCIUM: 10 mg/dL (ref 8.4–10.5)
CO2: 25 mEq/L (ref 19–32)
Chloride: 99 mEq/L (ref 96–112)
Creatinine, Ser: 0.7 mg/dL (ref 0.50–1.10)
GFR calc Af Amer: >90 mL/min (ref >90)
GFR calc non Af Amer: >90 mL/min (ref >90)
Glucose, Bld: 114 mg/dL — ABNORMAL HIGH (ref 70–99)
Potassium: 4.1 mEq/L (ref 3.7–5.3)
SODIUM: 137 meq/L (ref 137–147)
TOTAL PROTEIN: 7.9 g/dL (ref 6.0–8.3)
Total Bilirubin: 0.3 mg/dL (ref 0.3–1.2)

## 2014-08-11 LAB — URINALYSIS, ROUTINE W REFLEX MICROSCOPIC
Bilirubin Urine: NEGATIVE
Glucose, UA: NEGATIVE mg/dL
Ketones, ur: NEGATIVE mg/dL
LEUKOCYTES UA: NEGATIVE
NITRITE: NEGATIVE
PH: 5 (ref 5.0–8.0)
Protein, ur: NEGATIVE mg/dL
SPECIFIC GRAVITY, URINE: 1.021 (ref 1.005–1.030)
Urobilinogen, UA: 0.2 mg/dL (ref 0.0–1.0)

## 2014-08-11 LAB — URINE MICROSCOPIC-ADD ON

## 2014-08-11 LAB — LIPASE, BLOOD: Lipase: 29 U/L (ref 11–59)

## 2014-08-11 LAB — PREGNANCY, URINE: PREG TEST UR: NEGATIVE

## 2014-08-11 MED ORDER — OMEPRAZOLE 20 MG PO CPDR: 20.0000 mg | DELAYED_RELEASE_CAPSULE | Freq: Every day | ORAL | Status: DC

## 2014-08-11 MED ORDER — GI COCKTAIL ~~LOC~~
30.0000 mL | Freq: Once | ORAL | Status: AC
  Administered 2014-08-11: 30 mL via ORAL
  Filled 2014-08-11: qty 30

## 2014-08-11 MED ORDER — ONDANSETRON 8 MG PO TBDP
8.0000 mg | ORAL_TABLET | Freq: Once | ORAL | Status: AC
  Administered 2014-08-11: 8 mg via ORAL
  Filled 2014-08-11: qty 1

## 2014-08-11 NOTE — ED Provider Notes (Addendum)
CSN: [id number]     Arrival date & time 08/11/14  2029 History  This chart was scribed for Latanya Hemmer Smitty Cords, MD by Julian Hy, ED Scribe. The patient was seen in [facility]. The patient's care was started at 11:39 PM.    Chief Complaint  Patient presents with  . Abdominal Pain  . Emesis   Patient is a 28 y.o. female presenting with abdominal pain and vomiting. The history is provided by the patient. No language interpreter was used.  Abdominal Pain Pain quality: cramping   Pain radiates to:  Epigastric region Pain severity:  Severe Onset quality:  Sudden Duration:  12 hours Timing:  Constant Progression:  Worsening Chronicity:  New Context: eating   Relieved by: prilosec. Worsened by:  Nothing tried Associated symptoms: nausea and vomiting   Associated symptoms: no diarrhea and no fever   Risk factors: no alcohol abuse   Emesis Associated symptoms: abdominal pain   Associated symptoms: no diarrhea    HPI Comments: Erin Bullock is a 28 y.o. female who presents to the Emergency Department complaining of new, constant, severe mid-line abdominal pain onset 16 hours ago that is greatly improved.. Pt reports indigestion. Pt describes her pain as crampy and sharp. Pt reports nausea and vomiting. Pt reports she vomited twice. Pt reports she took Prilosec. Pt denies fever and diarrhea.  Past Medical History  Diagnosis Date  . Pregnancy induced hypertension     after first delivery, post partem  . Urinary tract infection   . Chlamydia   . Gonorrhea   . Depression   . Acid reflux    Past Surgical History  Procedure Laterality Date  . Induced abortion     Family History  Problem Relation Age of Onset  . Diabetes Mother   . Stroke Mother   . Hypertension Mother   . Cancer Mother   . Hypertension Father   . Cancer Maternal Aunt   . Heart disease Maternal Grandmother    History  Substance Use Topics  . Smoking status: Never Smoker   . Smokeless tobacco: Never Used  .  Alcohol Use: No     Comment: none with preg   OB History   Grav Para Term Preterm Abortions TAB SAB Ect Mult Living   4 2 2  0 2 1 1  0 0 2     Review of Systems  Constitutional: Negative for fever.  Gastrointestinal: Positive for nausea, vomiting and abdominal pain. Negative for diarrhea.  All other systems reviewed and are negative.     Allergies  Review of patient's allergies indicates no known allergies.  Home Medications   Prior to Admission medications   Medication Sig Start Date End Date Taking? Authorizing Provider  oxyCODONE-acetaminophen (ROXICET) 5-325 MG per tablet Take 1 tablet by mouth every 4 (four) hours as needed for severe pain. 02/21/14   Dione Booze, MD  penicillin v potassium (VEETID) 500 MG tablet Take 1 tablet (500 mg total) by mouth 3 (three) times daily. 01/10/14   Hope Orlene Och, NP   Triage Vitals: BP 120/66  Pulse 79  Temp(Src) 98 F (36.7 C) (Oral)  Resp 20  Ht 5\' 1"  (1.549 m)  Wt 173 lb (78.472 kg)  BMI 32.70 kg/m2  SpO2 100% Physical Exam  Nursing note and vitals reviewed. Constitutional: She is oriented to person, place, and time. She appears well-developed and well-nourished.  HENT:  Head: Normocephalic and atraumatic.  Mouth/Throat: Oropharynx is clear and moist.  Eyes: Conjunctivae and EOM  are normal. Pupils are equal, round, and reactive to light.  Neck: Normal range of motion.  Cardiovascular: Normal rate, regular rhythm, normal heart sounds and intact distal pulses.   Pulmonary/Chest: Effort normal and breath sounds normal. She has no wheezes. She has no rales.  Abdominal: Soft. Bowel sounds are increased. There is no tenderness. There is no rebound and no guarding.  Musculoskeletal: Normal range of motion.  Neurological: She is alert and oriented to person, place, and time.  Skin: Skin is warm and dry.  Psychiatric: She has a normal mood and affect.    ED Course  Procedures (including critical care time) DIAGNOSTIC  STUDIES: Oxygen Saturation is 100% on RA, normal by my interpretation.    COORDINATION OF CARE: 11:45 PM- Patient informed of current plan for treatment and evaluation and agrees with plan at this time.  Labs Review Labs Reviewed  CBC WITH DIFFERENTIAL - Abnormal; Notable for the following:    Hemoglobin 11.8 (*)    HCT 35.6 (*)    All other components within normal limits  COMPREHENSIVE METABOLIC PANEL - Abnormal; Notable for the following:    Glucose, Bld 114 (*)    All other components within normal limits  URINALYSIS, ROUTINE W REFLEX MICROSCOPIC - Abnormal; Notable for the following:    Hgb urine dipstick MODERATE (*)    All other components within normal limits  URINE MICROSCOPIC-ADD ON - Abnormal; Notable for the following:    Squamous Epithelial / LPF FEW (*)    Bacteria, UA FEW (*)    All other components within normal limits  LIPASE, BLOOD  PREGNANCY, URINE    Imaging Review No results found.   EKG Interpretation None      MDM   Final diagnoses:  None    GERD prilosec and bland diet follow up with your PMD  I personally performed the services described in this documentation, which was scribed in my presence. The recorded information has been reviewed and is accurate.     Jasmine AweApril K Zareena Willis-Rasch, MD 08/11/14 2347  Nain Rudd K Canaan Prue-Rasch, MD 08/11/14 2348

## 2014-08-11 NOTE — ED Notes (Signed)
Pt reports indigestion that started earlier today, then pt began severe mid-line abd pain and through to back pt describes as sharp cramping - pt also w/ x2 episodes of n/v as well - denies fever or diarrhea.

## 2014-09-15 ENCOUNTER — Emergency Department (HOSPITAL_BASED_OUTPATIENT_CLINIC_OR_DEPARTMENT_OTHER): Payer: 59

## 2014-09-15 ENCOUNTER — Encounter (HOSPITAL_BASED_OUTPATIENT_CLINIC_OR_DEPARTMENT_OTHER): Payer: Self-pay | Admitting: Emergency Medicine

## 2014-09-15 ENCOUNTER — Emergency Department (HOSPITAL_BASED_OUTPATIENT_CLINIC_OR_DEPARTMENT_OTHER)
Admission: EM | Admit: 2014-09-15 | Discharge: 2014-09-15 | Disposition: A | Discharge status: Home / Self Care | Payer: 59 | Attending: Emergency Medicine | Admitting: Emergency Medicine

## 2014-09-15 DIAGNOSIS — Z79899 Other long term (current) drug therapy: Secondary | ICD-10-CM | POA: Insufficient documentation

## 2014-09-15 DIAGNOSIS — N390 Urinary tract infection, site not specified: Secondary | ICD-10-CM | POA: Insufficient documentation

## 2014-09-15 DIAGNOSIS — Z8619 Personal history of other infectious and parasitic diseases: Secondary | ICD-10-CM | POA: Insufficient documentation

## 2014-09-15 DIAGNOSIS — R1013 Epigastric pain: Secondary | ICD-10-CM | POA: Diagnosis present

## 2014-09-15 DIAGNOSIS — Z3202 Encounter for pregnancy test, result negative: Secondary | ICD-10-CM | POA: Diagnosis not present

## 2014-09-15 DIAGNOSIS — K8020 Calculus of gallbladder without cholecystitis without obstruction: Secondary | ICD-10-CM | POA: Insufficient documentation

## 2014-09-15 DIAGNOSIS — K219 Gastro-esophageal reflux disease without esophagitis: Secondary | ICD-10-CM | POA: Insufficient documentation

## 2014-09-15 DIAGNOSIS — Z8659 Personal history of other mental and behavioral disorders: Secondary | ICD-10-CM | POA: Insufficient documentation

## 2014-09-15 LAB — CBC WITH DIFFERENTIAL/PLATELET
Basophils Absolute: 0 10*3/uL (ref 0.0–0.1)
Basophils Relative: 0 % (ref 0–1)
EOS ABS: 0.1 10*3/uL (ref 0.0–0.7)
Eosinophils Relative: 1 % (ref 0–5)
HCT: 37.9 % (ref 36.0–46.0)
HEMOGLOBIN: 12.7 g/dL (ref 12.0–15.0)
Lymphocytes Relative: 31 % (ref 12–46)
Lymphs Abs: 2.1 10*3/uL (ref 0.7–4.0)
MCH: 31 pg (ref 26.0–34.0)
MCHC: 33.5 g/dL (ref 30.0–36.0)
MCV: 92.4 fL (ref 78.0–100.0)
MONOS PCT: 7 % (ref 3–12)
Monocytes Absolute: 0.5 10*3/uL (ref 0.1–1.0)
NEUTROS PCT: 61 % (ref 43–77)
Neutro Abs: 4.1 10*3/uL (ref 1.7–7.7)
Platelets: 280 10*3/uL (ref 150–400)
RBC: 4.1 MIL/uL (ref 3.87–5.11)
RDW: 12.8 % (ref 11.5–15.5)
WBC: 6.8 10*3/uL (ref 4.0–10.5)

## 2014-09-15 LAB — COMPREHENSIVE METABOLIC PANEL
ALBUMIN: 4 g/dL (ref 3.5–5.2)
ALK PHOS: 72 U/L (ref 39–117)
ALT: 18 U/L (ref 0–35)
ANION GAP: 13 (ref 5–15)
AST: 19 U/L (ref 0–37)
BUN: 8 mg/dL (ref 6–23)
CO2: 25 mEq/L (ref 19–32)
CREATININE: 0.7 mg/dL (ref 0.50–1.10)
Calcium: 9.1 mg/dL (ref 8.4–10.5)
Chloride: 102 mEq/L (ref 96–112)
GFR calc non Af Amer: >90 mL/min (ref >90)
GLUCOSE: 117 mg/dL — AB (ref 70–99)
Potassium: 4 mEq/L (ref 3.7–5.3)
Sodium: 140 mEq/L (ref 137–147)
TOTAL PROTEIN: 7.7 g/dL (ref 6.0–8.3)
Total Bilirubin: <0.2 mg/dL — ABNORMAL LOW (ref 0.3–1.2)

## 2014-09-15 LAB — URINALYSIS, ROUTINE W REFLEX MICROSCOPIC
Bilirubin Urine: NEGATIVE
Glucose, UA: NEGATIVE mg/dL
Ketones, ur: NEGATIVE mg/dL
Nitrite: NEGATIVE
PROTEIN: NEGATIVE mg/dL
Specific Gravity, Urine: 1.018 (ref 1.005–1.030)
UROBILINOGEN UA: 0.2 mg/dL (ref 0.0–1.0)
pH: 6.5 (ref 5.0–8.0)

## 2014-09-15 LAB — PREGNANCY, URINE: Preg Test, Ur: NEGATIVE

## 2014-09-15 LAB — URINE MICROSCOPIC-ADD ON

## 2014-09-15 LAB — LIPASE, BLOOD: LIPASE: 29 U/L (ref 11–59)

## 2014-09-15 MED ORDER — ONDANSETRON HCL 4 MG/2ML IJ SOLN
4.0000 mg | Freq: Once | INTRAMUSCULAR | Status: AC
  Administered 2014-09-15: 4 mg via INTRAVENOUS
  Filled 2014-09-15: qty 2

## 2014-09-15 MED ORDER — ONDANSETRON HCL 4 MG PO TABS: 4.0000 mg | ORAL_TABLET | Freq: Four times a day (QID) | ORAL | Status: DC

## 2014-09-15 MED ORDER — CEFTRIAXONE SODIUM 1 G IJ SOLR
INTRAMUSCULAR | Status: AC
  Filled 2014-09-15: qty 10

## 2014-09-15 MED ORDER — CEPHALEXIN 500 MG PO CAPS: 500.0000 mg | ORAL_CAPSULE | Freq: Two times a day (BID) | ORAL | Status: DC

## 2014-09-15 MED ORDER — HYDROMORPHONE HCL 1 MG/ML IJ SOLN
0.5000 mg | INTRAMUSCULAR | Status: DC | PRN
  Administered 2014-09-15: 0.5 mg via INTRAVENOUS
  Filled 2014-09-15: qty 1

## 2014-09-15 MED ORDER — DEXTROSE 5 % IV SOLN
1.0000 g | Freq: Once | INTRAVENOUS | Status: AC
  Administered 2014-09-15: 1 g via INTRAVENOUS

## 2014-09-15 MED ORDER — SODIUM CHLORIDE 0.9 % IV SOLN
1000.0000 mL | INTRAVENOUS | Status: DC
Start: 2014-09-15 — End: 2014-09-15

## 2014-09-15 MED ORDER — HYDROCODONE-ACETAMINOPHEN 5-325 MG PO TABS: 1.0000 | ORAL_TABLET | ORAL | Status: DC | PRN

## 2014-09-15 MED ORDER — SODIUM CHLORIDE 0.9 % IV SOLN
1000.0000 mL | Freq: Once | INTRAVENOUS | Status: AC
  Administered 2014-09-15: 1000 mL via INTRAVENOUS

## 2014-09-15 NOTE — Discharge Instructions (Signed)
Urinary Tract Infection A urinary tract infection (UTI) can occur any place along the urinary tract. The tract includes the kidneys, ureters, bladder, and urethra. A type of germ called bacteria often causes a UTI. UTIs are often helped with antibiotic medicine.  HOME CARE   If given, take antibiotics as told by your doctor. Finish them even if you start to feel better.  Drink enough fluids to keep your pee (urine) clear or pale yellow.  Avoid tea, drinks with caffeine, and bubbly (carbonated) drinks.  Pee often. Avoid holding your pee in for a long time.  Pee before and after having sex (intercourse).  Wipe from front to back after you poop (bowel movement) if you are a woman. Use each tissue only once. GET HELP RIGHT AWAY IF:   You have back pain.  You have lower belly (abdominal) pain.  You have chills.  You feel sick to your stomach (nauseous).  You throw up (vomit).  Your burning or discomfort with peeing does not go away.  You have a fever.  Your symptoms are not better in 3 days. MAKE SURE YOU:   Understand these instructions.  Will watch your condition.  Will get help right away if you are not doing well or get worse. Document Released: 05/31/2008 Document Revised: 09/06/2012 Document Reviewed: 07/13/2012 [facility] Patient Information 2015 [place], [place]. This information is not intended to replace advice given to you by your health care provider. Make sure you discuss any questions you have with your health care provider.  Cholelithiasis Cholelithiasis (also called gallstones) is a form of gallbladder disease in which gallstones form in your gallbladder. The gallbladder is an organ that stores bile made in the liver, which helps digest fats. Gallstones begin as small crystals and slowly grow into stones. Gallstone pain occurs when the gallbladder spasms and a gallstone is blocking the duct. Pain can also occur when a stone passes out of the duct.  RISK  FACTORS  Being female.   Having multiple pregnancies. Health care providers sometimes advise removing diseased gallbladders before future pregnancies.   Being obese.  Eating a diet heavy in fried foods and fat.   Being older than 60 years and increasing age.   Prolonged use of medicines containing female hormones.   Having diabetes mellitus.   Rapidly losing weight.   Having a family history of gallstones (heredity).  SYMPTOMS  Nausea.   Vomiting.  Abdominal pain.   Yellowing of the skin (jaundice).   Sudden pain. It may persist from several minutes to several hours.  Fever.   Tenderness to the touch. In some cases, when gallstones do not move into the bile duct, people have no pain or symptoms. These are called "silent" gallstones.  TREATMENT Silent gallstones do not need treatment. In severe cases, emergency surgery may be required. Options for treatment include:  Surgery to remove the gallbladder. This is the most common treatment.  Medicines. These do not always work and may take 6-12 months or more to work.  Shock wave treatment (extracorporeal biliary lithotripsy). In this treatment an ultrasound machine sends shock waves to the gallbladder to break gallstones into smaller pieces that can pass into the intestines or be dissolved by medicine. HOME CARE INSTRUCTIONS   Only take over-the-counter or prescription medicines for pain, discomfort, or fever as directed by your health care provider.   Follow a low-fat diet until seen again by your health care provider. Fat causes the gallbladder to contract, which can result in pain.  Follow up with your health care provider as directed. Attacks are almost always recurrent and surgery is usually required for permanent treatment.  SEEK IMMEDIATE MEDICAL CARE IF:   Your pain increases and is not controlled by medicines.   You have a fever or persistent symptoms for more than 2-3 days.   You have a  fever and your symptoms suddenly get worse.   You have persistent nausea and vomiting.  MAKE SURE YOU:   Understand these instructions.  Will watch your condition.  Will get help right away if you are not doing well or get worse. Document Released: 12/09/2005 Document Revised: 08/15/2013 Document Reviewed: 06/06/2013 [facility] Patient Information 2015 [place], [place]. This information is not intended to replace advice given to you by your health care provider. Make sure you discuss any questions you have with your health care provider.

## 2014-09-15 NOTE — ED Notes (Signed)
PT discharged to home with family. NAD. 

## 2014-09-15 NOTE — ED Provider Notes (Signed)
CSN: [id number]     Arrival date & time 09/15/14  4098 History   First MD Initiated Contact with Patient 09/15/14 0732     Chief Complaint  Patient presents with  . Abdominal Pain    Patient is a 28 y.o. female presenting with abdominal pain. The history is provided by the patient.  Abdominal Pain Pain location:  Epigastric Pain quality: sharp   Pain radiates to:  Back Pain severity:  Severe Onset quality:  Sudden Duration:  5 hours Timing:  Constant Chronicity:  Recurrent (She has had this in the past and it has been attributed to GERD) Context: awakening from sleep   Context: not diet changes, not recent illness and not trauma   Relieved by:  Nothing Ineffective treatments:  Antacids Associated symptoms: vomiting   Associated symptoms: no anorexia, no belching, no chest pain, no constipation, no cough, no diarrhea, no fever, no vaginal bleeding and no vaginal discharge     Past Medical History  Diagnosis Date  . Pregnancy induced hypertension     after first delivery, post partem  . Urinary tract infection   . Chlamydia   . Gonorrhea   . Depression   . Acid reflux    Past Surgical History  Procedure Laterality Date  . Induced abortion     Family History  Problem Relation Age of Onset  . Diabetes Mother   . Stroke Mother   . Hypertension Mother   . Cancer Mother   . Hypertension Father   . Cancer Maternal Aunt   . Heart disease Maternal Grandmother    History  Substance Use Topics  . Smoking status: Never Smoker   . Smokeless tobacco: Never Used  . Alcohol Use: No     Comment: none with preg   OB History   Grav Para Term Preterm Abortions TAB SAB Ect Mult Living   0 0 0 2     Review of Systems  Constitutional: Negative for fever.  Respiratory: Negative for cough.   Cardiovascular: Negative for chest pain.  Gastrointestinal: Positive for vomiting and abdominal pain. Negative for diarrhea, constipation and anorexia.  Genitourinary: Negative  for vaginal bleeding and vaginal discharge.  All other systems reviewed and are negative.     Allergies  Review of patient's allergies indicates no known allergies.  Home Medications   Prior to Admission medications   Medication Sig Start Date End Date Taking? Authorizing Provider  esomeprazole (NEXIUM) 20 MG capsule Take 20 mg by mouth daily at 12 noon.   Yes Historical Provider, MD  cephALEXin (KEFLEX) 500 MG capsule Take 1 capsule (500 mg total) by mouth 2 (two) times daily. 09/15/14   Linwood Dibbles, MD  HYDROcodone-acetaminophen (NORCO/VICODIN) 5-325 MG per tablet Take 1-2 tablets by mouth every 4 (four) hours as needed. 09/15/14   Linwood Dibbles, MD  ondansetron (ZOFRAN) 4 MG tablet Take 1 tablet (4 mg total) by mouth every 6 (six) hours. 09/15/14   Linwood Dibbles, MD   BP 108/59  Pulse 61  Temp(Src) 98.1 F (36.7 C) (Oral)  Resp 18  Ht  (1.549 m)  Wt 170 lb (77.111 kg)  BMI 32.14 kg/m2  SpO2 100%  LMP 09/13/2014 Physical Exam  Nursing note and vitals reviewed. Constitutional: She appears well-developed and well-nourished. No distress.  HENT:  Head: Normocephalic and atraumatic.  Right Ear: External ear normal.  Left Ear: External ear normal.  Eyes: Conjunctivae are normal. Right eye exhibits no discharge.  Left eye exhibits no discharge. No scleral icterus.  Neck: Neck supple. No tracheal deviation present.  Cardiovascular: Normal rate, regular rhythm and intact distal pulses.   Pulmonary/Chest: Effort normal and breath sounds normal. No stridor. No respiratory distress. She has no wheezes. She has no rales.  Abdominal: Soft. Bowel sounds are normal. She exhibits no distension and no mass. There is tenderness in the epigastric area. There is no rebound and no guarding. No hernia.  Musculoskeletal: She exhibits no edema and no tenderness.  Neurological: She is alert. She has normal strength. No cranial nerve deficit (no facial droop, extraocular movements intact, no slurred speech)  or sensory deficit. She exhibits normal muscle tone. She displays no seizure activity. Coordination normal.  Skin: Skin is warm and dry. No rash noted.  Psychiatric: She has a normal mood and affect.    ED Course  Procedures (including critical care time) Labs Review Labs Reviewed  URINALYSIS, ROUTINE W REFLEX MICROSCOPIC - Abnormal; Notable for the following:    APPearance CLOUDY (*)    Hgb urine dipstick LARGE (*)    Leukocytes, UA LARGE (*)    All other components within normal limits  COMPREHENSIVE METABOLIC PANEL - Abnormal; Notable for the following:    Glucose, Bld 117 (*)    Total Bilirubin <0.2 (*)    All other components within normal limits  URINE MICROSCOPIC-ADD ON - Abnormal; Notable for the following:    Bacteria, UA MANY (*)    All other components within normal limits  PREGNANCY, URINE  CBC WITH DIFFERENTIAL  LIPASE, BLOOD    Imaging Review US Abdomen Complete  09/15/2014   CLINICAL DATA:  Right upper and right lower abdominal pain  EXAM: ULTRASOUND ABDOMEN COMPLETE  COMPARISON:  Abdomen and pelvis CT dated 05/20/2009.  FINDINGS: Gallbladder:  Multiple gallstones measuring up to 1.1 cm in maximum diameter each. Mild diffuse wall thickening. No pericholecystic fluid. It was difficult to assess for tenderness over the gallbladder due to previously administered pain medication.  Common bile duct:  Diameter: 3.3 mm  Liver:  No focal lesion identified. Within normal limits in parenchymal echogenicity.  IVC:  No abnormality visualized.  Pancreas:  Visualized portion unremarkable.  Spleen:  Size and appearance within normal limits.  Right Kidney:  Length: 11.2 cm. Echogenicity within normal limits. No mass or hydronephrosis visualized.  Left Kidney:  Length: 10.5 cm. Echogenicity within normal limits. No mass or hydronephrosis visualized.  Abdominal aorta:  No aneurysm visualized.  Other findings:  None.  IMPRESSION: 1. Cholelithiasis. 2. Mild diffuse gallbladder wall thickening.  This could be due to acute or chronic cholecystitis.   Electronically Signed   By: Gordan Payment M.D.   On: 09/15/2014 11:45   Medications  0.9 %  sodium chloride infusion (1,000 mLs Intravenous New Bag/Given 09/15/14 0801)    Followed by  0.9 %  sodium chloride infusion (not administered)  HYDROmorphone (DILAUDID) injection 0.5 mg (0.5 mg Intravenous Given 09/15/14 0803)  cefTRIAXone (ROCEPHIN) 1 g in dextrose 5 % 50 mL IVPB (1 g Intravenous New Bag/Given 09/15/14 1248)  cefTRIAXone (ROCEPHIN) 1 G injection (not administered)  ondansetron (ZOFRAN) injection 4 mg (4 mg Intravenous Given 09/15/14 0802)     MDM   Final diagnoses:  UTI (lower urinary tract infection)  Calculus of gallbladder without cholecystitis without obstruction    The patient's ultrasound does show a gallstones with diffuse gallbladder wall thickening.  She has not had a fever. She does not have an elevation of  white blood cell count lipase LFTs.  I suspect her recurrent episodes are related to her gallstones however, she did does also have a urinary tract infection. This could be contributing to his symptoms today as well.  Patient had improvement with treatment in the emergency room however she feels like her abdominal pain is returning. Cannot exclude the possibility of early cholecystitis. I will consult with general surgery at [facility].  Discussed the case with Dr. Johna Sheriff. The patient was given the option to go to the [facility] long emergency room now to be evaluated versus letting her go home with medications for pain and nausea and a prescription for antibiotics for her urinary tract infection. The patient opted to go home. Should like to followup with general surgery as an outpatient. She understands that if she has worsening symptoms, fever or vomiting she is to go to [facility] long emergency room immediately.   Linwood Dibbles, MD 09/15/14 1250

## 2014-09-15 NOTE — ED Notes (Signed)
Patient reports that she was awoken at 0300 with sharp general abdominal pain with 3 episodes of vomiting, no diarrhea, denies urinary symptoms. Patient reports that she thinks it is her acid reflux, takes nexium daily.

## 2014-09-17 ENCOUNTER — Observation Stay (HOSPITAL_COMMUNITY)
Admission: EM | Admit: 2014-09-17 | Discharge: 2014-09-19 | Disposition: A | Discharge status: Home / Self Care | Payer: 59 | Attending: Surgery | Admitting: Surgery

## 2014-09-17 ENCOUNTER — Emergency Department (HOSPITAL_COMMUNITY): Payer: 59

## 2014-09-17 ENCOUNTER — Encounter (HOSPITAL_COMMUNITY): Payer: Self-pay | Admitting: Emergency Medicine

## 2014-09-17 DIAGNOSIS — I10 Essential (primary) hypertension: Secondary | ICD-10-CM | POA: Diagnosis not present

## 2014-09-17 DIAGNOSIS — F329 Major depressive disorder, single episode, unspecified: Secondary | ICD-10-CM | POA: Diagnosis not present

## 2014-09-17 DIAGNOSIS — K811 Chronic cholecystitis: Secondary | ICD-10-CM | POA: Diagnosis present

## 2014-09-17 DIAGNOSIS — A5400 Gonococcal infection of lower genitourinary tract, unspecified: Secondary | ICD-10-CM | POA: Diagnosis not present

## 2014-09-17 DIAGNOSIS — K810 Acute cholecystitis: Secondary | ICD-10-CM

## 2014-09-17 DIAGNOSIS — K8010 Calculus of gallbladder with chronic cholecystitis without obstruction: Principal | ICD-10-CM | POA: Insufficient documentation

## 2014-09-17 DIAGNOSIS — F3289 Other specified depressive episodes: Secondary | ICD-10-CM | POA: Insufficient documentation

## 2014-09-17 DIAGNOSIS — A749 Chlamydial infection, unspecified: Secondary | ICD-10-CM | POA: Insufficient documentation

## 2014-09-17 DIAGNOSIS — Z9049 Acquired absence of other specified parts of digestive tract: Secondary | ICD-10-CM

## 2014-09-17 DIAGNOSIS — K8020 Calculus of gallbladder without cholecystitis without obstruction: Secondary | ICD-10-CM | POA: Diagnosis present

## 2014-09-17 DIAGNOSIS — K219 Gastro-esophageal reflux disease without esophagitis: Secondary | ICD-10-CM

## 2014-09-17 DIAGNOSIS — N390 Urinary tract infection, site not specified: Secondary | ICD-10-CM

## 2014-09-17 HISTORY — DX: Urinary tract infection, site not specified: N39.0

## 2014-09-17 HISTORY — DX: Acute cholecystitis: K81.0

## 2014-09-17 LAB — COMPREHENSIVE METABOLIC PANEL
ALT: 15 U/L (ref 0–35)
AST: 14 U/L (ref 0–37)
Albumin: 4 g/dL (ref 3.5–5.2)
Alkaline Phosphatase: 70 U/L (ref 39–117)
Anion gap: 12 (ref 5–15)
BUN: 10 mg/dL (ref 6–23)
CALCIUM: 8.8 mg/dL (ref 8.4–10.5)
CO2: 24 meq/L (ref 19–32)
CREATININE: 0.67 mg/dL (ref 0.50–1.10)
Chloride: 102 mEq/L (ref 96–112)
GLUCOSE: 103 mg/dL — AB (ref 70–99)
Potassium: 3.7 mEq/L (ref 3.7–5.3)
Sodium: 138 mEq/L (ref 137–147)
TOTAL PROTEIN: 7.3 g/dL (ref 6.0–8.3)
Total Bilirubin: <0.2 mg/dL — ABNORMAL LOW (ref 0.3–1.2)

## 2014-09-17 LAB — CBC WITH DIFFERENTIAL/PLATELET
Basophils Absolute: 0 10*3/uL (ref 0.0–0.1)
Basophils Relative: 0 % (ref 0–1)
EOS ABS: 0.1 10*3/uL (ref 0.0–0.7)
EOS PCT: 1 % (ref 0–5)
HEMATOCRIT: 41.5 % (ref 36.0–46.0)
Hemoglobin: 13.9 g/dL (ref 12.0–15.0)
LYMPHS PCT: 39 % (ref 12–46)
Lymphs Abs: 2 10*3/uL (ref 0.7–4.0)
MCH: 30.5 pg (ref 26.0–34.0)
MCHC: 33.5 g/dL (ref 30.0–36.0)
MCV: 91 fL (ref 78.0–100.0)
MONO ABS: 0.2 10*3/uL (ref 0.1–1.0)
Monocytes Relative: 5 % (ref 3–12)
Neutro Abs: 2.8 10*3/uL (ref 1.7–7.7)
Neutrophils Relative %: 55 % (ref 43–77)
PLATELETS: 204 10*3/uL (ref 150–400)
RBC: 4.56 MIL/uL (ref 3.87–5.11)
RDW: 12.9 % (ref 11.5–15.5)
WBC: 5.1 10*3/uL (ref 4.0–10.5)

## 2014-09-17 LAB — URINALYSIS, ROUTINE W REFLEX MICROSCOPIC
Bilirubin Urine: NEGATIVE
Glucose, UA: NEGATIVE mg/dL
KETONES UR: NEGATIVE mg/dL
NITRITE: NEGATIVE
Protein, ur: NEGATIVE mg/dL
Specific Gravity, Urine: 1.008 (ref 1.005–1.030)
UROBILINOGEN UA: 0.2 mg/dL (ref 0.0–1.0)
pH: 6 (ref 5.0–8.0)

## 2014-09-17 LAB — URINE MICROSCOPIC-ADD ON

## 2014-09-17 LAB — LIPASE, BLOOD: LIPASE: 24 U/L (ref 11–59)

## 2014-09-17 LAB — PREGNANCY, URINE: Preg Test, Ur: NEGATIVE

## 2014-09-17 LAB — SURGICAL PCR SCREEN
MRSA, PCR: NEGATIVE
Staphylococcus aureus: NEGATIVE

## 2014-09-17 MED ORDER — ONDANSETRON HCL 4 MG/2ML IJ SOLN
4.0000 mg | Freq: Once | INTRAMUSCULAR | Status: AC
  Administered 2014-09-17: 4 mg via INTRAVENOUS
  Filled 2014-09-17: qty 2

## 2014-09-17 MED ORDER — MORPHINE SULFATE 2 MG/ML IJ SOLN
2.0000 mg | INTRAMUSCULAR | Status: DC | PRN
  Administered 2014-09-18: 2 mg via INTRAVENOUS
  Filled 2014-09-17: qty 1

## 2014-09-17 MED ORDER — ENOXAPARIN SODIUM 40 MG/0.4ML ~~LOC~~ SOLN
40.0000 mg | SUBCUTANEOUS | Status: DC
  Administered 2014-09-17: 40 mg via SUBCUTANEOUS
  Filled 2014-09-17 (×3): qty 0.4

## 2014-09-17 MED ORDER — DIPHENHYDRAMINE HCL 50 MG/ML IJ SOLN: 12.5000 mg | Freq: Four times a day (QID) | INTRAMUSCULAR | Status: DC | PRN

## 2014-09-17 MED ORDER — ACETAMINOPHEN 650 MG RE SUPP: 650.0000 mg | Freq: Four times a day (QID) | RECTAL | Status: DC | PRN

## 2014-09-17 MED ORDER — INFLUENZA VAC SPLIT QUAD 0.5 ML IM SUSY
0.5000 mL | PREFILLED_SYRINGE | INTRAMUSCULAR | Status: DC
  Filled 2014-09-17 (×2): qty 0.5

## 2014-09-17 MED ORDER — CEFAZOLIN SODIUM-DEXTROSE 2-3 GM-% IV SOLR
2.0000 g | INTRAVENOUS | Status: AC
  Administered 2014-09-18: 2 g via INTRAVENOUS
  Filled 2014-09-17: qty 50

## 2014-09-17 MED ORDER — ACETAMINOPHEN 325 MG PO TABS: 650.0000 mg | ORAL_TABLET | Freq: Four times a day (QID) | ORAL | Status: DC | PRN

## 2014-09-17 MED ORDER — DIPHENHYDRAMINE HCL 12.5 MG/5ML PO ELIX: 12.5000 mg | ORAL_SOLUTION | Freq: Four times a day (QID) | ORAL | Status: DC | PRN

## 2014-09-17 MED ORDER — MORPHINE SULFATE 4 MG/ML IJ SOLN
4.0000 mg | Freq: Once | INTRAMUSCULAR | Status: AC
  Administered 2014-09-17: 4 mg via INTRAVENOUS
  Filled 2014-09-17: qty 1

## 2014-09-17 MED ORDER — ONDANSETRON HCL 4 MG/2ML IJ SOLN: 4.0000 mg | Freq: Four times a day (QID) | INTRAMUSCULAR | Status: DC | PRN

## 2014-09-17 MED ORDER — HYDROCODONE-ACETAMINOPHEN 5-325 MG PO TABS
1.0000 | ORAL_TABLET | ORAL | Status: DC | PRN
  Administered 2014-09-17: 2 via ORAL
  Filled 2014-09-17: qty 2

## 2014-09-17 MED ORDER — DEXTROSE-NACL 5-0.9 % IV SOLN
INTRAVENOUS | Status: DC
  Administered 2014-09-17 (×2): via INTRAVENOUS
  Administered 2014-09-18: 1000 mL via INTRAVENOUS

## 2014-09-17 MED ORDER — SODIUM CHLORIDE 0.9 % IV BOLUS (SEPSIS)
1000.0000 mL | Freq: Once | INTRAVENOUS | Status: AC
  Administered 2014-09-17: 1000 mL via INTRAVENOUS

## 2014-09-17 NOTE — ED Notes (Signed)
Patient c/o RUQ abdominal pain radiating to back x1 day with 2 episodes of vomiting. Denies diarrhea, fever, chills, urinary symptoms. Rates pain "constant 10/10".

## 2014-09-17 NOTE — ED Provider Notes (Signed)
CSN: [id number]     Arrival date & time 09/17/14  0258 History   First MD Initiated Contact with Patient 09/17/14 [phone]     Chief Complaint  Patient presents with  . Abdominal Pain     (Consider location/radiation/quality/duration/timing/severity/associated sxs/prior Treatment) HPI Patient presents with right upper quadrant pain radiating to her back. Onset around 9 PM yesterday pain. Started roughly 30 minutes after eating cake and icing. Is associated with nausea and 2 episodes of vomiting. Pain has been constant. No fever or chills. Seen several days ago at [facility] and diagnosed with cholelithiasis. Advised to followup with surgery as an outpatient or return to the emergency department should her symptoms worsen. Past Medical History  Diagnosis Date  . Pregnancy induced hypertension     after first delivery, post partem  . Urinary tract infection   . Chlamydia   . Gonorrhea   . Depression   . Acid reflux    Past Surgical History  Procedure Laterality Date  . Induced abortion     Family History  Problem Relation Age of Onset  . Diabetes Mother   . Stroke Mother   . Hypertension Mother   . Cancer Mother   . Hypertension Father   . Cancer Maternal Aunt   . Heart disease Maternal Grandmother    History  Substance Use Topics  . Smoking status: Never Smoker   . Smokeless tobacco: Never Used  . Alcohol Use: No     Comment: none with preg   OB History   Grav Para Term Preterm Abortions TAB SAB Ect Mult Living   0 0 0 2     Review of Systems  Constitutional: Negative for fever and chills.  Respiratory: Negative for shortness of breath.   Cardiovascular: Negative for chest pain.  Gastrointestinal: Positive for nausea, vomiting and abdominal pain. Negative for diarrhea.  Genitourinary: Negative for dysuria, frequency and flank pain.  Musculoskeletal: Positive for back pain. Negative for neck pain and neck stiffness.  Skin: Negative for rash and  wound.  Neurological: Negative for dizziness, weakness, light-headedness, numbness and headaches.  All other systems reviewed and are negative.     Allergies  Review of patient's allergies indicates no known allergies.  Home Medications   Prior to Admission medications   Medication Sig Start Date End Date Taking? Authorizing Provider  esomeprazole (NEXIUM) 20 MG capsule Take 20 mg by mouth daily at 12 noon.   Yes Historical Provider, MD  cephALEXin (KEFLEX) 500 MG capsule Take 1 capsule (500 mg total) by mouth 2 (two) times daily. 09/15/14   Linwood Dibbles, MD  HYDROcodone-acetaminophen (NORCO/VICODIN) 5-325 MG per tablet Take 1-2 tablets by mouth every 4 (four) hours as needed. 09/15/14   Linwood Dibbles, MD  ondansetron (ZOFRAN) 4 MG tablet Take 1 tablet (4 mg total) by mouth every 6 (six) hours. 09/15/14   Linwood Dibbles, MD   BP 116/91  Pulse 74  Temp(Src) 97.9 F (36.6 C) (Oral)  Resp 20  Ht  (1.549 m)  Wt 170 lb (77.111 kg)  BMI 32.14 kg/m2  SpO2 97%  LMP 09/13/2014 Physical Exam  Nursing note and vitals reviewed. Constitutional: She is oriented to person, place, and time. She appears well-developed and well-nourished.  Uncomfortable  HENT:  Head: Normocephalic and atraumatic.  Mouth/Throat: Oropharynx is clear and moist.  Eyes: EOM are normal. Pupils are equal, round, and reactive to light.  Neck: Normal range of motion. Neck  supple.  Cardiovascular: Normal rate and regular rhythm.   Pulmonary/Chest: Effort normal and breath sounds normal. No respiratory distress. She has no wheezes. She has no rales.  Abdominal: Soft. Bowel sounds are normal. She exhibits no distension and no mass. There is tenderness (right upper quadrant tenderness with palpation.). There is no rebound and no guarding.  Musculoskeletal: Normal range of motion. She exhibits no edema and no tenderness.  No CVA tenderness bilaterally.  Neurological: She is alert and oriented to person, place, and time.  Skin:  Skin is warm and dry. No rash noted. No erythema.  Psychiatric: She has a normal mood and affect. Her behavior is normal.    ED Course  Procedures (including critical care time) Labs Review Labs Reviewed  CBC WITH DIFFERENTIAL  COMPREHENSIVE METABOLIC PANEL  LIPASE, BLOOD  URINALYSIS, ROUTINE W REFLEX MICROSCOPIC  PREGNANCY, URINE    Imaging Review US Abdomen Complete  09/15/2014   CLINICAL DATA:  Right upper and right lower abdominal pain  EXAM: ULTRASOUND ABDOMEN COMPLETE  COMPARISON:  Abdomen and pelvis CT dated 05/20/2009.  FINDINGS: Gallbladder:  Multiple gallstones measuring up to 1.1 cm in maximum diameter each. Mild diffuse wall thickening. No pericholecystic fluid. It was difficult to assess for tenderness over the gallbladder due to previously administered pain medication.  Common bile duct:  Diameter: 3.3 mm  Liver:  No focal lesion identified. Within normal limits in parenchymal echogenicity.  IVC:  No abnormality visualized.  Pancreas:  Visualized portion unremarkable.  Spleen:  Size and appearance within normal limits.  Right Kidney:  Length: 11.2 cm. Echogenicity within normal limits. No mass or hydronephrosis visualized.  Left Kidney:  Length: 10.5 cm. Echogenicity within normal limits. No mass or hydronephrosis visualized.  Abdominal aorta:  No aneurysm visualized.  Other findings:  None.  IMPRESSION: 1. Cholelithiasis. 2. Mild diffuse gallbladder wall thickening. This could be due to acute or chronic cholecystitis.   Electronically Signed   By: Gordan Payment M.D.   On: 09/15/2014 11:45   US Abdomen Limited Ruq  09/17/2014   CLINICAL DATA:  Right upper quadrant pain  EXAM: US ABDOMEN LIMITED - RIGHT UPPER QUADRANT  COMPARISON:  None.  FINDINGS: Gallbladder:  Multiple echogenic shadowing stones present within the gallbladder lumen. Probable gallbladder sludge present as well. Gallbladder wall measure within normal limits at 2.5 mm. No free pericholecystic fluid. A positive  sonographic Murphy sign was elicited on exam.  Common bile duct:  Diameter: 4.1 mm  Liver:  No focal lesion identified. Within normal limits in parenchymal echogenicity.  IMPRESSION: 1. Cholelithiasis with positive sonographic Murphy sign but without gallbladder wall thickening or free pericholecystic fluid. Correlation for possible acute cholecystitis recommended. 2. No biliary dilatation.   Electronically Signed   By: Rise Mu M.D.   On: 09/17/2014 05:59     EKG Interpretation None      MDM   Final diagnoses:  None    Patient with likely cholecystitis. Will check labs and repeat ultrasound. Will need surgery consult.  Discussed with Dr. Andrey Campanile. Will have PA evaluate in the ED.  Loren Racer, MD 09/17/14 [phone]

## 2014-09-17 NOTE — ED Notes (Signed)
Pt complains of gallbladder pain since Sunday, was seen at [facility] and was told to come here if the pain got worse.

## 2014-09-17 NOTE — H&P (Signed)
Chief Complaint: RUQ abdominal pain HPI: Erin Bullock is a 28 year old healthy female who presents to [facility] with recurrent RUQ abdominal pain.  Duration of symptoms is >6 months.  On set was gradual.  Coarse is worsening.  Time pattern is intermittent.  Location is RUQ with radiation to the back.  Moderate in severity.  Characterized as sharp pain.  Aggravated by food, particularly fatty foods.  Typically starts in the middle of the night.  No alleviating factors.  Modifying factors include; nexium.  She reports being seen in the ED about 5 times this year last being on the 22nd.  Her work up shows normal white count, normal LFTs.  [place] of abdomen with cholelithiasis, positive murphy's signs without GB wall thickening or pericholecystic fluid.  At present time, her pain has improved with IV pain meds.  She is NPO.  She is otherwise healthy.  Denies previous history of abdominal surgeries.   Past Medical History  Diagnosis Date  . Pregnancy induced hypertension     after first delivery, post partem  . Urinary tract infection   . Chlamydia   . Gonorrhea   . Depression   . Acid reflux     Past Surgical History  Procedure Laterality Date  . Induced abortion      Family History  Problem Relation Age of Onset  . Diabetes Mother   . Stroke Mother   . Hypertension Mother   . Cancer Mother   . Hypertension Father   . Cancer Maternal Aunt   . Heart disease Maternal Grandmother    Social History:  reports that she has never smoked. She has never used smokeless tobacco. She reports that she does not drink alcohol or use illicit drugs.  Allergies: No Known Allergies  Medication Nexium  (Not in a hospital admission)  Results for orders placed during the hospital encounter of 09/17/14 (from the past 48 hour(s))  CBC WITH DIFFERENTIAL     Status: None   Collection Time    09/17/14  6:14 AM      Result Value Ref Range   WBC 5.1  4.0 - 10.5 K/uL   RBC 4.56  3.87 - 5.11 MIL/uL   Hemoglobin  13.9  12.0 - 15.0 g/dL   HCT 41.5  36.0 - 46.0 %   MCV 91.0  78.0 - 100.0 fL   MCH 30.5  26.0 - 34.0 pg   MCHC 33.5  30.0 - 36.0 g/dL   RDW 12.9  11.5 - 15.5 %   Platelets 204  150 - 400 K/uL   Neutrophils Relative % 55  43 - 77 %   Neutro Abs 2.8  1.7 - 7.7 K/uL   Lymphocytes Relative 39  12 - 46 %   Lymphs Abs 2.0  0.7 - 4.0 K/uL   Monocytes Relative 5  3 - 12 %   Monocytes Absolute 0.2  0.1 - 1.0 K/uL   Eosinophils Relative 1  0 - 5 %   Eosinophils Absolute 0.1  0.0 - 0.7 K/uL   Basophils Relative 0  0 - 1 %   Basophils Absolute 0.0  0.0 - 0.1 K/uL  COMPREHENSIVE METABOLIC PANEL     Status: Abnormal   Collection Time    09/17/14  6:14 AM      Result Value Ref Range   Sodium 138  137 - 147 mEq/L   Potassium 3.7  3.7 - 5.3 mEq/L   Chloride 102  96 - 112 mEq/L  CO2 24  19 - 32 mEq/L   Glucose, Bld 103 (*) 70 - 99 mg/dL   BUN 10  6 - 23 mg/dL   Creatinine, Ser 0.67  0.50 - 1.10 mg/dL   Calcium 8.8  8.4 - 10.5 mg/dL   Total Protein 7.3  6.0 - 8.3 g/dL   Albumin 4.0  3.5 - 5.2 g/dL   AST 14  0 - 37 U/L   ALT 15  0 - 35 U/L   Alkaline Phosphatase 70  39 - 117 U/L   Total Bilirubin <0.2 (*) 0.3 - 1.2 mg/dL   GFR calc non Af Amer >90  >90 mL/min   GFR calc Af Amer >90  >90 mL/min   Comment: (NOTE)     The eGFR has been calculated using the CKD EPI equation.     This calculation has not been validated in all clinical situations.     eGFR's persistently <90 mL/min signify possible Chronic Kidney     Disease.   Anion gap 12  5 - 15  LIPASE, BLOOD     Status: None   Collection Time    09/17/14  6:14 AM      Result Value Ref Range   Lipase 24  11 - 59 U/L  URINALYSIS, ROUTINE W REFLEX MICROSCOPIC     Status: Abnormal   Collection Time    09/17/14  6:53 AM      Result Value Ref Range   Color, Urine YELLOW  YELLOW   APPearance CLEAR  CLEAR   Specific Gravity, Urine 1.008  1.005 - 1.030   pH 6.0  5.0 - 8.0   Glucose, UA NEGATIVE  NEGATIVE mg/dL   Hgb urine dipstick LARGE  (*) NEGATIVE   Bilirubin Urine NEGATIVE  NEGATIVE   Ketones, ur NEGATIVE  NEGATIVE mg/dL   Protein, ur NEGATIVE  NEGATIVE mg/dL   Urobilinogen, UA 0.2  0.0 - 1.0 mg/dL   Nitrite NEGATIVE  NEGATIVE   Leukocytes, UA SMALL (*) NEGATIVE  PREGNANCY, URINE     Status: None   Collection Time    09/17/14  6:53 AM      Result Value Ref Range   Preg Test, Ur NEGATIVE  NEGATIVE   Comment:            THE SENSITIVITY OF THIS     METHODOLOGY IS >20 mIU/mL.  URINE MICROSCOPIC-ADD ON     Status: Abnormal   Collection Time    09/17/14  6:53 AM      Result Value Ref Range   Squamous Epithelial / LPF RARE  RARE   WBC, UA 3-6  <3 WBC/hpf   RBC / HPF 11-20  <3 RBC/hpf   Bacteria, UA MANY (*) RARE   US Abdomen Complete  09/15/2014   CLINICAL DATA:  Right upper and right lower abdominal pain  EXAM: ULTRASOUND ABDOMEN COMPLETE  COMPARISON:  Abdomen and pelvis CT dated 05/20/2009.  FINDINGS: Gallbladder:  Multiple gallstones measuring up to 1.1 cm in maximum diameter each. Mild diffuse wall thickening. No pericholecystic fluid. It was difficult to assess for tenderness over the gallbladder due to previously administered pain medication.  Common bile duct:  Diameter: 3.3 mm  Liver:  No focal lesion identified. Within normal limits in parenchymal echogenicity.  IVC:  No abnormality visualized.  Pancreas:  Visualized portion unremarkable.  Spleen:  Size and appearance within normal limits.  Right Kidney:  Length: 11.2 cm. Echogenicity within normal limits. No mass or hydronephrosis visualized.  Left Kidney:  Length: 10.5 cm. Echogenicity within normal limits. No mass or hydronephrosis visualized.  Abdominal aorta:  No aneurysm visualized.  Other findings:  None.  IMPRESSION: 1. Cholelithiasis. 2. Mild diffuse gallbladder wall thickening. This could be due to acute or chronic cholecystitis.   Electronically Signed   By: Enrique Sack M.D.   On: 09/15/2014 11:45   US Abdomen Limited Ruq  09/17/2014   CLINICAL DATA:   Right upper quadrant pain  EXAM: US ABDOMEN LIMITED - RIGHT UPPER QUADRANT  COMPARISON:  None.  FINDINGS: Gallbladder:  Multiple echogenic shadowing stones present within the gallbladder lumen. Probable gallbladder sludge present as well. Gallbladder wall measure within normal limits at 2.5 mm. No free pericholecystic fluid. A positive sonographic Murphy sign was elicited on exam.  Common bile duct:  Diameter: 4.1 mm  Liver:  No focal lesion identified. Within normal limits in parenchymal echogenicity.  IMPRESSION: 1. Cholelithiasis with positive sonographic Murphy sign but without gallbladder wall thickening or free pericholecystic fluid. Correlation for possible acute cholecystitis recommended. 2. No biliary dilatation.   Electronically Signed   By: Jeannine Boga M.D.   On: 09/17/2014 05:59    Review of Systems  All other systems reviewed and are negative.   Blood pressure 105/60, pulse 60, temperature 97.9 F (36.6 C), temperature source Oral, resp. rate 18, height '5\' 1"'  (1.549 m), weight 170 lb (77.111 kg), last menstrual period 09/13/2014, SpO2 100.00%. Physical Exam  Constitutional: She is oriented to person, place, and time. She appears well-developed and well-nourished. No distress.  Neck: Normal range of motion. Neck supple.  Cardiovascular: Normal rate, regular rhythm and intact distal pulses.  Exam reveals friction rub. Exam reveals no gallop.   No murmur heard. Respiratory: Effort normal and breath sounds normal. No respiratory distress. She has no wheezes. She has no rales. She exhibits no tenderness.  GI: Soft. Bowel sounds are normal. She exhibits no distension and no mass. There is no rebound and no guarding.  TTP RUQ  Musculoskeletal: She exhibits no edema and no tenderness.  Lymphadenopathy:    She has no cervical adenopathy.  Neurological: She is alert and oriented to person, place, and time.  Skin: Skin is warm. No rash noted. She is not diaphoretic. No erythema.   Psychiatric: She has a normal mood and affect. Her behavior is normal. Judgment and thought content normal.     Assessment/Plan Biliary colic admit for pain control, plan for laparoscopic cholecystectomy tomorrow morning.  Risks of the surgery were discussed including but not limited to infection, bleeding, injury to surrounding structures, anesthesia risks.  She verbalizes understanding and wishes to proceed.  She may have clears today, NPO after midnight, IVF to start at midnight.  Ancef on call to OR. VTE prophylaxis -SCD/lovenox, up ad lib   Ayline Dingus ANP-BC 09/17/2014, 8:03 AM

## 2014-09-17 NOTE — ED Notes (Signed)
Bed: WA03 Expected date:  Expected time:  Means of arrival:  Comments: 

## 2014-09-18 ENCOUNTER — Observation Stay (HOSPITAL_COMMUNITY): Payer: 59

## 2014-09-18 ENCOUNTER — Encounter (HOSPITAL_COMMUNITY)
Admission: EM | Disposition: A | Discharge status: Home / Self Care | Payer: Self-pay | Source: Home / Self Care | Attending: Emergency Medicine

## 2014-09-18 ENCOUNTER — Observation Stay (HOSPITAL_COMMUNITY): Payer: 59 | Admitting: Certified Registered"

## 2014-09-18 ENCOUNTER — Encounter (HOSPITAL_COMMUNITY): Payer: 59 | Admitting: Certified Registered"

## 2014-09-18 DIAGNOSIS — Z9049 Acquired absence of other specified parts of digestive tract: Secondary | ICD-10-CM

## 2014-09-18 HISTORY — PX: CHOLECYSTECTOMY: SHX55

## 2014-09-18 LAB — CBC
HEMATOCRIT: 39.4 % (ref 36.0–46.0)
Hemoglobin: 12.8 g/dL (ref 12.0–15.0)
MCH: 30 pg (ref 26.0–34.0)
MCHC: 32.5 g/dL (ref 30.0–36.0)
MCV: 92.3 fL (ref 78.0–100.0)
Platelets: 271 10*3/uL (ref 150–400)
RBC: 4.27 MIL/uL (ref 3.87–5.11)
RDW: 13 % (ref 11.5–15.5)
WBC: 11.5 10*3/uL — ABNORMAL HIGH (ref 4.0–10.5)

## 2014-09-18 LAB — CREATININE, SERUM
Creatinine, Ser: 0.69 mg/dL (ref 0.50–1.10)
GFR calc Af Amer: >90 mL/min (ref >90)
GFR calc non Af Amer: >90 mL/min (ref >90)

## 2014-09-18 SURGERY — LAPAROSCOPIC CHOLECYSTECTOMY WITH INTRAOPERATIVE CHOLANGIOGRAM
Anesthesia: General | Site: Abdomen

## 2014-09-18 MED ORDER — LIDOCAINE HCL (CARDIAC) 20 MG/ML IV SOLN
INTRAVENOUS | Status: DC | PRN
  Administered 2014-09-18: 50 mg via INTRAVENOUS

## 2014-09-18 MED ORDER — LACTATED RINGERS IV SOLN: INTRAVENOUS | Status: DC

## 2014-09-18 MED ORDER — 0.9 % SODIUM CHLORIDE (POUR BTL) OPTIME
TOPICAL | Status: DC | PRN
  Administered 2014-09-18: 1000 mL

## 2014-09-18 MED ORDER — GLYCOPYRROLATE 0.2 MG/ML IJ SOLN
INTRAMUSCULAR | Status: DC | PRN
  Administered 2014-09-18: 0.6 mg via INTRAVENOUS

## 2014-09-18 MED ORDER — ROCURONIUM BROMIDE 100 MG/10ML IV SOLN
INTRAVENOUS | Status: DC | PRN
  Administered 2014-09-18: 5 mg via INTRAVENOUS
  Administered 2014-09-18: 10 mg via INTRAVENOUS
  Administered 2014-09-18: 25 mg via INTRAVENOUS

## 2014-09-18 MED ORDER — BUPIVACAINE-EPINEPHRINE (PF) 0.25% -1:200000 IJ SOLN
INTRAMUSCULAR | Status: AC
  Filled 2014-09-18: qty 30

## 2014-09-18 MED ORDER — NEOSTIGMINE METHYLSULFATE 10 MG/10ML IV SOLN
INTRAVENOUS | Status: DC | PRN
  Administered 2014-09-18: 4 mg via INTRAVENOUS

## 2014-09-18 MED ORDER — ONDANSETRON HCL 4 MG/2ML IJ SOLN: 4.0000 mg | Freq: Four times a day (QID) | INTRAMUSCULAR | Status: DC | PRN

## 2014-09-18 MED ORDER — DEXAMETHASONE SODIUM PHOSPHATE 10 MG/ML IJ SOLN
INTRAMUSCULAR | Status: DC | PRN
  Administered 2014-09-18: 10 mg via INTRAVENOUS

## 2014-09-18 MED ORDER — NEOSTIGMINE METHYLSULFATE 10 MG/10ML IV SOLN
INTRAVENOUS | Status: AC
  Filled 2014-09-18: qty 1

## 2014-09-18 MED ORDER — GLYCOPYRROLATE 0.2 MG/ML IJ SOLN
INTRAMUSCULAR | Status: AC
  Filled 2014-09-18: qty 3

## 2014-09-18 MED ORDER — IOHEXOL 300 MG/ML  SOLN
INTRAMUSCULAR | Status: DC | PRN
  Administered 2014-09-18: 2.5 mL

## 2014-09-18 MED ORDER — ONDANSETRON HCL 4 MG PO TABS: 4.0000 mg | ORAL_TABLET | Freq: Four times a day (QID) | ORAL | Status: DC | PRN

## 2014-09-18 MED ORDER — SUCCINYLCHOLINE CHLORIDE 20 MG/ML IJ SOLN
INTRAMUSCULAR | Status: DC | PRN
  Administered 2014-09-18: 100 mg via INTRAVENOUS

## 2014-09-18 MED ORDER — MORPHINE SULFATE 2 MG/ML IJ SOLN
INTRAMUSCULAR | Status: AC
  Filled 2014-09-18: qty 1

## 2014-09-18 MED ORDER — LIDOCAINE HCL (CARDIAC) 20 MG/ML IV SOLN
INTRAVENOUS | Status: AC
  Filled 2014-09-18: qty 5

## 2014-09-18 MED ORDER — KCL IN DEXTROSE-NACL 20-5-0.45 MEQ/L-%-% IV SOLN
INTRAVENOUS | Status: DC
  Administered 2014-09-18: 19:00:00 via INTRAVENOUS
  Filled 2014-09-18 (×4): qty 1000

## 2014-09-18 MED ORDER — PROPOFOL 10 MG/ML IV BOLUS
INTRAVENOUS | Status: DC | PRN
  Administered 2014-09-18: 180 mg via INTRAVENOUS

## 2014-09-18 MED ORDER — PROPOFOL 10 MG/ML IV BOLUS
INTRAVENOUS | Status: AC
  Filled 2014-09-18: qty 20

## 2014-09-18 MED ORDER — HYDROMORPHONE HCL 1 MG/ML IJ SOLN
INTRAMUSCULAR | Status: AC
  Filled 2014-09-18: qty 1

## 2014-09-18 MED ORDER — MORPHINE SULFATE 2 MG/ML IJ SOLN
1.0000 mg | INTRAMUSCULAR | Status: DC | PRN
  Administered 2014-09-18: 1 mg via INTRAVENOUS

## 2014-09-18 MED ORDER — HEPARIN SODIUM (PORCINE) 5000 UNIT/ML IJ SOLN
5000.0000 [IU] | Freq: Three times a day (TID) | INTRAMUSCULAR | Status: DC
  Administered 2014-09-18 (×2): 5000 [IU] via SUBCUTANEOUS
  Filled 2014-09-18 (×6): qty 1

## 2014-09-18 MED ORDER — ONDANSETRON HCL 4 MG/2ML IJ SOLN
INTRAMUSCULAR | Status: DC | PRN
  Administered 2014-09-18: 4 mg via INTRAVENOUS

## 2014-09-18 MED ORDER — FENTANYL CITRATE 0.05 MG/ML IJ SOLN
INTRAMUSCULAR | Status: DC | PRN
  Administered 2014-09-18: 100 ug via INTRAVENOUS
  Administered 2014-09-18 (×3): 50 ug via INTRAVENOUS

## 2014-09-18 MED ORDER — HYDROCODONE-ACETAMINOPHEN 5-325 MG PO TABS
1.0000 | ORAL_TABLET | ORAL | Status: DC | PRN
  Administered 2014-09-18: 1 via ORAL
  Filled 2014-09-18: qty 1

## 2014-09-18 MED ORDER — CEFAZOLIN SODIUM-DEXTROSE 2-3 GM-% IV SOLR
INTRAVENOUS | Status: AC
  Filled 2014-09-18: qty 50

## 2014-09-18 MED ORDER — LACTATED RINGERS IR SOLN
Status: DC | PRN
  Administered 2014-09-18: 1000 mL

## 2014-09-18 MED ORDER — LACTATED RINGERS IV SOLN
INTRAVENOUS | Status: DC
  Administered 2014-09-18: 12:00:00 via INTRAVENOUS
  Administered 2014-09-18: 1000 mL via INTRAVENOUS

## 2014-09-18 MED ORDER — FENTANYL CITRATE 0.05 MG/ML IJ SOLN
INTRAMUSCULAR | Status: AC
  Filled 2014-09-18: qty 5

## 2014-09-18 MED ORDER — DEXAMETHASONE SODIUM PHOSPHATE 10 MG/ML IJ SOLN
INTRAMUSCULAR | Status: AC
  Filled 2014-09-18: qty 1

## 2014-09-18 MED ORDER — ROCURONIUM BROMIDE 100 MG/10ML IV SOLN
INTRAVENOUS | Status: AC
  Filled 2014-09-18: qty 1

## 2014-09-18 MED ORDER — BUPIVACAINE-EPINEPHRINE (PF) 0.25% -1:200000 IJ SOLN
INTRAMUSCULAR | Status: DC | PRN
  Administered 2014-09-18: 30 mL

## 2014-09-18 MED ORDER — MIDAZOLAM HCL 5 MG/5ML IJ SOLN
INTRAMUSCULAR | Status: DC | PRN
  Administered 2014-09-18: 2 mg via INTRAVENOUS

## 2014-09-18 MED ORDER — HYDROMORPHONE HCL 1 MG/ML IJ SOLN
0.2500 mg | INTRAMUSCULAR | Status: DC | PRN
  Administered 2014-09-18 (×3): 0.5 mg via INTRAVENOUS

## 2014-09-18 MED ORDER — KCL IN DEXTROSE-NACL 20-5-0.45 MEQ/L-%-% IV SOLN
INTRAVENOUS | Status: AC
Start: 2014-09-18 — End: 2014-09-19
  Filled 2014-09-18: qty 1000

## 2014-09-18 MED ORDER — ONDANSETRON HCL 4 MG/2ML IJ SOLN
INTRAMUSCULAR | Status: AC
  Filled 2014-09-18: qty 2

## 2014-09-18 MED ORDER — MIDAZOLAM HCL 2 MG/2ML IJ SOLN
INTRAMUSCULAR | Status: AC
  Filled 2014-09-18: qty 2

## 2014-09-18 SURGICAL SUPPLY — 39 items
ADH SKN CLS APL DERMABOND .7 (GAUZE/BANDAGES/DRESSINGS) ×1
APL SKNCLS STERI-STRIP NONHPOA (GAUZE/BANDAGES/DRESSINGS)
APPLIER CLIP ROT 10 11.4 M/L (STAPLE) ×2
APR CLP MED LRG 11.4X10 (STAPLE) ×1
BAG SPEC RTRVL 10 TROC 200 (ENDOMECHANICALS) ×1
BENZOIN TINCTURE PRP APPL 2/3 (GAUZE/BANDAGES/DRESSINGS) IMPLANT
CABLE HIGH FREQUENCY MONO STRZ (ELECTRODE) IMPLANT
CATH REDDICK CHOLANGI 4FR 50CM (CATHETERS) ×2 IMPLANT
CLIP APPLIE ROT 10 11.4 M/L (STAPLE) ×1 IMPLANT
COVER MAYO STAND STRL (DRAPES) ×2 IMPLANT
DECANTER SPIKE VIAL GLASS SM (MISCELLANEOUS) ×1 IMPLANT
DERMABOND ADVANCED (GAUZE/BANDAGES/DRESSINGS) ×1
DERMABOND ADVANCED .7 DNX12 (GAUZE/BANDAGES/DRESSINGS) IMPLANT
DRAPE C-ARM 42X120 X-RAY (DRAPES) ×2 IMPLANT
DRAPE LAPAROSCOPIC ABDOMINAL (DRAPES) ×2 IMPLANT
ELECT REM PT RETURN 9FT ADLT (ELECTROSURGICAL) ×2
ELECTRODE REM PT RTRN 9FT ADLT (ELECTROSURGICAL) ×1 IMPLANT
GLOVE BIOGEL M 8.0 STRL (GLOVE) ×2 IMPLANT
GOWN STRL REUS W/TWL XL LVL3 (GOWN DISPOSABLE) ×8 IMPLANT
HEMOSTAT SURGICEL 4X8 (HEMOSTASIS) IMPLANT
IV CATH 14GX2 1/4 (CATHETERS) ×2 IMPLANT
KIT BASIN OR (CUSTOM PROCEDURE TRAY) ×2 IMPLANT
POUCH RETRIEVAL ECOSAC 10 (ENDOMECHANICALS) ×1 IMPLANT
POUCH RETRIEVAL ECOSAC 10MM (ENDOMECHANICALS) ×1
SCISSORS LAP 5X45 EPIX DISP (ENDOMECHANICALS) ×2 IMPLANT
SCRUB PCMX 4 OZ (MISCELLANEOUS) ×2 IMPLANT
SET IRRIG TUBING LAPAROSCOPIC (IRRIGATION / IRRIGATOR) ×2 IMPLANT
SLEEVE XCEL OPT CAN 5 100 (ENDOMECHANICALS) ×4 IMPLANT
SOLUTION ANTI FOG 6CC (MISCELLANEOUS) ×2 IMPLANT
STRIP CLOSURE SKIN 1/2X4 (GAUZE/BANDAGES/DRESSINGS) IMPLANT
SUT VIC AB 4-0 SH 18 (SUTURE) ×2 IMPLANT
SYR 20CC LL (SYRINGE) ×2 IMPLANT
TOWEL OR 17X26 10 PK STRL BLUE (TOWEL DISPOSABLE) ×2 IMPLANT
TOWEL OR NON WOVEN STRL DISP B (DISPOSABLE) ×2 IMPLANT
TRAY LAP CHOLE (CUSTOM PROCEDURE TRAY) ×1 IMPLANT
TROCAR BLADELESS OPT 5 100 (ENDOMECHANICALS) ×2 IMPLANT
TROCAR XCEL BLUNT TIP 100MML (ENDOMECHANICALS) IMPLANT
TROCAR XCEL NON-BLD 11X100MML (ENDOMECHANICALS) ×2 IMPLANT
TUBING INSUFFLATION 10FT LAP (TUBING) ×2 IMPLANT

## 2014-09-18 NOTE — Anesthesia Postprocedure Evaluation (Signed)
  Anesthesia Post-op Note  Patient: Erin Bullock  Procedure(s) Performed: Procedure(s) (LRB): LAPAROSCOPIC CHOLECYSTECTOMY WITH INTRAOPERATIVE CHOLANGIOGRAM (N/A)  Patient Location: PACU  Anesthesia Type: General  Level of Consciousness: awake and alert   Airway and Oxygen Therapy: Patient Spontanous Breathing  Post-op Pain: mild  Post-op Assessment: Post-op Vital signs reviewed, Patient's Cardiovascular Status Stable, Respiratory Function Stable, Patent Airway and No signs of Nausea or vomiting  Last Vitals:  Filed Vitals:   09/18/14 1405  BP: 131/83  Pulse: 80  Temp: 37.2 C  Resp: 14    Post-op Vital Signs: stable   Complications: No apparent anesthesia complications

## 2014-09-18 NOTE — Interval H&P Note (Signed)
History and Physical Interval Note:  09/18/2014 11:03 AM  Erin Bullock  has presented today for surgery, with the diagnosis of biliary colic  The various methods of treatment have been discussed with the patient and family. After consideration of risks, benefits and other options for treatment, the patient has consented to  Procedure(s): LAPAROSCOPIC CHOLECYSTECTOMY WITH POSSIBLE INTRAOPERATIVE CHOLANGIOGRAM (N/A) as a surgical intervention .  The patient's history has been reviewed, patient examined, no change in status, stable for surgery.  I have reviewed the patient's chart and labs.  Questions were answered to the patient's satisfaction.     Angeliz Settlemyre B

## 2014-09-18 NOTE — Progress Notes (Signed)
Patient ID: Erin Bullock, female   DOB: 06/07/1986, 28 y.o.   MRN: [id number]     [address] [place], [place] [address]    Phone: [phone] FAX: [phone]     Subjective: Pain resolved.  VSS.  Afebrile.    Objective:  Vital signs:  Filed Vitals:   09/17/14 1800 09/17/14 2116 09/17/14 2120 09/18/14 0455  BP: 118/78 109/83  101/59  Pulse: 70 71 76 64  Temp: 98 F (36.7 C) 98.3 F (36.8 C)  98.2 F (36.8 C)  TempSrc: Oral Oral  Oral  Resp: '18 18  18  ' Height:      Weight:      SpO2: 100% 99%  100%    Last BM Date: 09/16/14  Intake/Output   Yesterday:  09/22 0701 - 09/23 0700 In: 4322.5 [P.O.:1960; I.V.:2362.5] Out: -  This shift:    I/O last 3 completed shifts: In: 4322.5 [P.O.:1960; I.V.:2362.5] Out: -      Physical Exam: General: Pt awake/alert/oriented x4 in no acute distress Chest: cta.  No chest wall pain w good excursion CV:  Pulses intact.  Regular rhythm MS: Normal AROM mjr joints.  No obvious deformity Abdomen: Soft.  Nondistended.  Nontender.  No evidence of peritonitis.  No incarcerated hernias. Ext:  SCDs BLE.  No mjr edema.  No cyanosis Skin: No petechiae / purpura   Problem List:   Active Problems:   Symptomatic cholelithiasis    Results:   Labs: Results for orders placed during the hospital encounter of 09/17/14 (from the past 48 hour(s))  CBC WITH DIFFERENTIAL     Status: None   Collection Time    09/17/14  6:14 AM      Result Value Ref Range   WBC 5.1  4.0 - 10.5 K/uL   RBC 4.56  3.87 - 5.11 MIL/uL   Hemoglobin 13.9  12.0 - 15.0 g/dL   HCT 41.5  36.0 - 46.0 %   MCV 91.0  78.0 - 100.0 fL   MCH 30.5  26.0 - 34.0 pg   MCHC 33.5  30.0 - 36.0 g/dL   RDW 12.9  11.5 - 15.5 %   Platelets 204  150 - 400 K/uL   Neutrophils Relative % 55  43 - 77 %   Neutro Abs 2.8  1.7 - 7.7 K/uL   Lymphocytes Relative 39  12 - 46 %   Lymphs Abs 2.0  0.7 - 4.0 K/uL   Monocytes  Relative 5  3 - 12 %   Monocytes Absolute 0.2  0.1 - 1.0 K/uL   Eosinophils Relative 1  0 - 5 %   Eosinophils Absolute 0.1  0.0 - 0.7 K/uL   Basophils Relative 0  0 - 1 %   Basophils Absolute 0.0  0.0 - 0.1 K/uL  COMPREHENSIVE METABOLIC PANEL     Status: Abnormal   Collection Time    09/17/14  6:14 AM      Result Value Ref Range   Sodium 138  137 - 147 mEq/L   Potassium 3.7  3.7 - 5.3 mEq/L   Chloride 102  96 - 112 mEq/L   CO2 24  19 - 32 mEq/L   Glucose, Bld 103 (*) 70 - 99 mg/dL   BUN 10  6 - 23 mg/dL   Creatinine, Ser 0.67  0.50 - 1.10 mg/dL   Calcium 8.8  8.4 - 10.5 mg/dL  Total Protein 7.3  6.0 - 8.3 g/dL   Albumin 4.0  3.5 - 5.2 g/dL   AST 14  0 - 37 U/L   ALT 15  0 - 35 U/L   Alkaline Phosphatase 70  39 - 117 U/L   Total Bilirubin <0.2 (*) 0.3 - 1.2 mg/dL   GFR calc non Af Amer >90  >90 mL/min   GFR calc Af Amer >90  >90 mL/min   Comment: (NOTE)     The eGFR has been calculated using the CKD EPI equation.     This calculation has not been validated in all clinical situations.     eGFR's persistently <90 mL/min signify possible Chronic Kidney     Disease.   Anion gap 12  5 - 15  LIPASE, BLOOD     Status: None   Collection Time    10-17-14  6:14 AM      Result Value Ref Range   Lipase 24  11 - 59 U/L  URINALYSIS, ROUTINE W REFLEX MICROSCOPIC     Status: Abnormal   Collection Time    2014/10/17  6:53 AM      Result Value Ref Range   Color, Urine YELLOW  YELLOW   APPearance CLEAR  CLEAR   Specific Gravity, Urine 1.008  1.005 - 1.030   pH 6.0  5.0 - 8.0   Glucose, UA NEGATIVE  NEGATIVE mg/dL   Hgb urine dipstick LARGE (*) NEGATIVE   Bilirubin Urine NEGATIVE  NEGATIVE   Ketones, ur NEGATIVE  NEGATIVE mg/dL   Protein, ur NEGATIVE  NEGATIVE mg/dL   Urobilinogen, UA 0.2  0.0 - 1.0 mg/dL   Nitrite NEGATIVE  NEGATIVE   Leukocytes, UA SMALL (*) NEGATIVE  PREGNANCY, URINE     Status: None   Collection Time    2014-10-17  6:53 AM      Result Value Ref Range   Preg Test,  Ur NEGATIVE  NEGATIVE   Comment:            THE SENSITIVITY OF THIS     METHODOLOGY IS >20 mIU/mL.  URINE MICROSCOPIC-ADD ON     Status: Abnormal   Collection Time    10-17-14  6:53 AM      Result Value Ref Range   Squamous Epithelial / LPF RARE  RARE   WBC, UA 3-6  <3 WBC/hpf   RBC / HPF 11-20  <3 RBC/hpf   Bacteria, UA MANY (*) RARE  SURGICAL PCR SCREEN     Status: None   Collection Time    10/17/14 11:36 AM      Result Value Ref Range   MRSA, PCR NEGATIVE  NEGATIVE   Staphylococcus aureus NEGATIVE  NEGATIVE   Comment:            The Xpert SA Assay (FDA     approved for NASAL specimens     in patients over 8 years of age),     is one component of     a comprehensive surveillance     program.  Test performance has     been validated by [employer] for patients greater     than or equal to 88 year old.     It is not intended     to diagnose infection nor to     guide or monitor treatment.    Imaging / Studies: US Abdomen Limited Ruq  10/17/2014   CLINICAL DATA:  Right upper  quadrant pain  EXAM: US ABDOMEN LIMITED - RIGHT UPPER QUADRANT  COMPARISON:  None.  FINDINGS: Gallbladder:  Multiple echogenic shadowing stones present within the gallbladder lumen. Probable gallbladder sludge present as well. Gallbladder wall measure within normal limits at 2.5 mm. No free pericholecystic fluid. A positive sonographic Murphy sign was elicited on exam.  Common bile duct:  Diameter: 4.1 mm  Liver:  No focal lesion identified. Within normal limits in parenchymal echogenicity.  IMPRESSION: 1. Cholelithiasis with positive sonographic Murphy sign but without gallbladder wall thickening or free pericholecystic fluid. Correlation for possible acute cholecystitis recommended. 2. No biliary dilatation.   Electronically Signed   By: Jeannine Boga M.D.   On: 09/17/2014 05:59    Medications / Allergies:  Scheduled Meds: .  ceFAZolin (ANCEF) IV  2 g Intravenous On Call to OR  . enoxaparin  (LOVENOX) injection  40 mg Subcutaneous Q24H  . Influenza vac split quadrivalent PF  0.5 mL Intramuscular Tomorrow-1000   Continuous Infusions: . dextrose 5 % and 0.9% NaCl 1,000 mL (09/18/14 0226)   PRN Meds:.acetaminophen, acetaminophen, diphenhydrAMINE, diphenhydrAMINE, HYDROcodone-acetaminophen, morphine injection, ondansetron  Antibiotics: Anti-infectives   Start     Dose/Rate Route Frequency Ordered Stop   09/18/14 0600  ceFAZolin (ANCEF) IVPB 2 g/50 mL premix    Comments:  Pharmacy may adjust dosing strength, interval, or rate of medication as needed for optimal therapy for the patient  Send with patient on call to the OR.  Anesthesia to complete antibiotic administration <36mn [facility]   2 g 100 mL/hr over 30 Minutes Intravenous On call to O.R. 09/17/14 0807 09/19/14 0559       Assessment/Plan Biliary colic  -OR this AM -IS -pain control/anti-emetics -SCD/lovenox, up ad lib -ancef on call to OR -IVF -possible DC this evening  EErby Pian [facility] Pager [phone](7A-4:30P) For consults and floor pages call [phone](7A-4:30P)  09/18/2014 8:24 AM

## 2014-09-18 NOTE — Plan of Care (Signed)
Problem: Phase I Progression Outcomes Goal: Pain controlled with appropriate interventions Outcome: Completed/Met Date Met:  09/18/14 preop pain control with prns

## 2014-09-18 NOTE — Progress Notes (Signed)
UR completed 

## 2014-09-18 NOTE — Op Note (Signed)
Alvin R Loder @  Procedure: Laparoscopic Cholecystectomy with intraoperative cholangiogram  Surgeon: [name], MD, FACS Asst:  none  Anes:  General  Drains: None  Findings: Moderately severe chronic cholecystitis  Description of Procedure: The patient was taken to OR 6 and given general anesthesia.  The patient was prepped with PCMX and draped sterilely. A time out was performed.  Access to the abdomen was achieved with 5 mm Optiview technique through the right upper quadrant.  Port placement included three 5 mm trocars and and upper midline 11 mm trocar.    The gallbladder was visualized and the fundus was grasped and the gallbladder was elevated. Traction on the infundibulum allowed for successful demonstration of the critical view. Inflammatory changes were chronic and moderately severe.  The cystic duct was identified and clipped up on the gallbladder and an incision was made in the cystic duct and the Reddick catheter was inserted after milking the cystic duct of any debris. A dynamic cholangiogram was performed which demonstrated good filling of the CBD and flow into the duodenum.    The cystic duct was then triple clipped and divided, the cystic artery was double clipped and divided and then the gallbladder was removed from the gallbladder bed. Removal of the gallbladder from the gallbladder bed was peformed with the hook electrocautery.  The gallbladder was then placed in a bag and brought out through one of the 10 mm trocar sites. The gallbladder bed was inspected and no bleeding or bile leaks were seen.   Laparoscopic visualization was used when closing fascial defects for trocar sites.   Incisions were injected with lidocaine and closed with 4-0 Vicryl and dermabond on the skin.  Sponge and needle count were correct.    The patient was taken to the recovery room in satisfactory condition.

## 2014-09-18 NOTE — Anesthesia Preprocedure Evaluation (Addendum)
Anesthesia Evaluation  Patient identified by MRN, date of birth, ID band Patient awake    Reviewed: Allergy & Precautions, H&P , NPO status , Patient's Chart, lab work & pertinent test results  Airway Mallampati: II TM Distance: >3 FB Neck ROM: full    Dental no notable dental hx. (+) Teeth Intact, Dental Advisory Given   Pulmonary neg pulmonary ROS,  breath sounds clear to auscultation  Pulmonary exam normal       Cardiovascular Exercise Tolerance: Good hypertension, negative cardio ROS  Rhythm:regular Rate:Normal     Neuro/Psych negative neurological ROS  negative psych ROS   GI/Hepatic negative GI ROS, Neg liver ROS, GERD-  Medicated and Controlled,  Endo/Other  negative endocrine ROS  Renal/GU negative Renal ROS  negative genitourinary   Musculoskeletal   Abdominal   Peds  Hematology negative hematology ROS (+)   Anesthesia Other Findings   Reproductive/Obstetrics negative OB ROS                          Anesthesia Physical Anesthesia Plan  ASA: II  Anesthesia Plan: General   Post-op Pain Management:    Induction: Intravenous  Airway Management Planned: Oral ETT  Additional Equipment:   Intra-op Plan:   Post-operative Plan: Extubation in OR  Informed Consent: I have reviewed the patients History and Physical, chart, labs and discussed the procedure including the risks, benefits and alternatives for the proposed anesthesia with the patient or authorized representative who has indicated his/her understanding and acceptance.   Dental Advisory Given  Plan Discussed with: CRNA and Surgeon  Anesthesia Plan Comments:         Anesthesia Quick Evaluation

## 2014-09-18 NOTE — Transfer of Care (Signed)
Immediate Anesthesia Transfer of Care Note  Patient: Erin Bullock  Procedure(s) Performed: Procedure(s): LAPAROSCOPIC CHOLECYSTECTOMY WITH INTRAOPERATIVE CHOLANGIOGRAM (N/A)  Patient Location: PACU  Anesthesia Type:General  Level of Consciousness: awake, alert  and oriented  Airway & Oxygen Therapy: Patient Spontanous Breathing and Patient connected to face mask oxygen  Post-op Assessment: Report given to PACU RN and Post -op Vital signs reviewed and stable  Post vital signs: Reviewed and stable  Complications: No apparent anesthesia complications

## 2014-09-18 NOTE — Discharge Instructions (Signed)
Laparoscopic Cholecystectomy, Care After °Refer to this sheet in the next few weeks. These instructions provide you with information on caring for yourself after your procedure. Your health care provider may also give you more specific instructions. Your treatment has been planned according to current medical practices, but problems sometimes occur. Call your health care provider if you have any problems or questions after your procedure. °WHAT TO EXPECT AFTER THE PROCEDURE °After your procedure, it is typical to have the following: °· Pain at your incision sites. You will be given pain medicines to control the pain. °· Mild nausea or vomiting. This should improve after the first 24 hours. °· Bloating and possibly shoulder pain from the gas used during the procedure. This will improve after the first 24 hours. °HOME CARE INSTRUCTIONS  °· Change bandages (dressings) as directed by your health care provider. °· Keep the wound dry and clean. You may wash the wound gently with soap and water. Gently blot or dab the area dry. °· Do not take baths or use swimming pools or hot tubs for 2 weeks or until your health care provider approves. °· Only take over-the-counter or prescription medicines as directed by your health care provider. °· Continue your normal diet as directed by your health care provider. °· Do not lift anything heavier than 10 pounds (4.5 kg) until your health care provider approves. °· Do not play contact sports for 1 week or until your health care provider approves. °SEEK MEDICAL CARE IF:  °· You have redness, swelling, or increasing pain in the wound. °· You notice yellowish-white fluid (pus) coming from the wound. °· You have drainage from the wound that lasts longer than 1 day. °· You notice a bad smell coming from the wound or dressing. °· Your surgical cuts (incisions) break open. °SEEK IMMEDIATE MEDICAL CARE IF:  °· You develop a rash. °· You have difficulty breathing. °· You have chest pain. °· You  have a fever. °· You have increasing pain in the shoulders (shoulder strap areas). °· You have dizzy episodes or faint while standing. °· You have severe abdominal pain. °· You feel sick to your stomach (nauseous) or throw up (vomit) and this lasts for more than 1 day. °Document Released: 12/13/2005 Document Revised: 10/03/2013 Document Reviewed: 07/25/2013 °ExitCare® Patient Information ©2015 [employer]. This information is not intended to replace advice given to you by your health care provider. Make sure you discuss any questions you have with your health care provider. ° °[facility] ______[facility] °LAPAROSCOPIC SURGERY: POST OP INSTRUCTIONS °Always review your discharge instruction sheet given to you by the facility where your surgery was performed. °IF YOU HAVE DISABILITY OR FAMILY LEAVE FORMS, YOU MUST BRING THEM TO THE OFFICE FOR PROCESSING.   °DO NOT GIVE THEM TO YOUR DOCTOR. ° °1. A prescription for pain medication may be given to you upon discharge.  Take your pain medication as prescribed, if needed.  If narcotic pain medicine is not needed, then you may take acetaminophen (Tylenol) or ibuprofen (Advil) as needed. °2. Take your usually prescribed medications unless otherwise directed. °3. If you need a refill on your pain medication, please contact your pharmacy.  They will contact our office to request authorization. Prescriptions will not be filled after 5pm or on week-ends. °4. You should follow a light diet the first few days after arrival home, such as soup and crackers, etc.  Be sure to include lots of fluids daily. °5. Most patients will experience some   swelling and bruising in the area of the incisions.  Ice packs will help.  Swelling and bruising can take several days to resolve.  °6. It is common to experience some constipation if taking pain medication after surgery.  Increasing fluid intake and taking a stool softener (such as Colace) will usually help or prevent this problem  from occurring.  A mild laxative (Milk of Magnesia or Miralax) should be taken according to package instructions if there are no bowel movements after 48 hours. °7. Unless discharge instructions indicate otherwise, you may remove your bandages 24-48 hours after surgery, and you may shower at that time.  You may have steri-strips (small skin tapes) in place directly over the incision.  These strips should be left on the skin for 7-10 days.  If your surgeon used skin glue on the incision, you may shower in 24 hours.  The glue will flake off over the next 2-3 weeks.  Any sutures or staples will be removed at the office during your follow-up visit. °8. ACTIVITIES:  You may resume regular (light) daily activities beginning the next day--such as daily self-care, walking, climbing stairs--gradually increasing activities as tolerated.  You may have sexual intercourse when it is comfortable.  Refrain from any heavy lifting or straining until approved by your doctor. °a. You may drive when you are no longer taking prescription pain medication, you can comfortably wear a seatbelt, and you can safely maneuver your car and apply brakes. °b. RETURN TO WORK:  __________________________________________________________ °9. You should see your doctor in the office for a follow-up appointment approximately 2-3 weeks after your surgery.  Make sure that you call for this appointment within a day or two after you arrive home to insure a convenient appointment time. °10. OTHER INSTRUCTIONS: __________________________________________________________________________________________________________________________ __________________________________________________________________________________________________________________________ °WHEN TO CALL YOUR DOCTOR: °1. Fever over 101.0 °2. Inability to urinate °3. Continued bleeding from incision. °4. Increased pain, redness, or drainage from the incision. °5. Increasing abdominal pain ° °The  clinic staff is available to answer your questions during regular business hours.  Please don’t hesitate to call and ask to speak to one of the nurses for clinical concerns.  If you have a medical emergency, go to the nearest emergency room or call 911.  A surgeon from [facility] is always on call at the hospital. °[address], [address], [place], [place]  [address] ? [address] [address], [place], [place]   [address] °[phone] ? [phone] ? FAX [phone] °Web site: [email or web address] °

## 2014-09-19 ENCOUNTER — Encounter: Payer: Self-pay | Admitting: General Surgery

## 2014-09-19 MED ORDER — IBUPROFEN 200 MG PO TABS: ORAL_TABLET | ORAL | Status: DC

## 2014-09-19 MED ORDER — HYDROCODONE-ACETAMINOPHEN 5-325 MG PO TABS: 1.0000 | ORAL_TABLET | ORAL | Status: DC | PRN

## 2014-09-19 MED ORDER — ACETAMINOPHEN 325 MG PO TABS: 650.0000 mg | ORAL_TABLET | Freq: Four times a day (QID) | ORAL | Status: DC | PRN

## 2014-09-19 NOTE — Progress Notes (Signed)
1 Day Post-Op  Subjective: She feels fine, up and eating ready to go home.  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  Objective: Vital signs in last 24 hours: Temp:  [97.9 F (36.6 C)-99.1 F (37.3 C)] 98.3 F (36.8 C) (09/24 0758) Pulse Rate:  [60-98] 84 (09/24 0758) Resp:  [12-16] 16 (09/24 0758) BP: (103-148)/(50-95) 130/78 mmHg (09/24 0758) SpO2:  [99 %-100 %] 100 % (09/24 0758) Last BM Date: 09/16/14 1560 PO  Regular diet Afebrile, VSS WBC is up IOC is negative Intake/Output from previous day: 09/23 0701 - 09/24 0700 In: 4053 [P.O.:1560; I.V.:2493] Out: 2500 [Urine:2500] Intake/Output this shift:    General appearance: alert, cooperative and no distress GI: soft sore, port sites look fine  Lab Results:   Recent Labs  09/17/14 0614 09/18/14 1614  WBC 5.1 11.5*  HGB 13.9 12.8  HCT 41.5 39.4  PLT 204 271    BMET  Recent Labs  09/17/14 0614 09/18/14 1614  NA 138  --   K 3.7  --   CL 102  --   CO2 24  --   GLUCOSE 103*  --   BUN 10  --   CREATININE 0.67 0.69  CALCIUM 8.8  --    PT/INR No results found for this basename: LABPROT, INR,  in the last 72 hours   Recent Labs Lab 09/15/14 0800 09/17/14 0614  AST 19 14  ALT 18 15  ALKPHOS 72 70  BILITOT <0.2* <0.2*  PROT 7.7 7.3  ALBUMIN 4.0 4.0     Lipase     Component Value Date/Time   LIPASE 24 09/17/2014 0614     Studies/Results: Dg Cholangiogram Operative  09/18/2014   CLINICAL DATA:  Right upper quadrant pain. Laparoscopic cholecystectomy.  EXAM: INTRAOPERATIVE CHOLANGIOGRAM  FLUOROSCOPY TIME:  12 seconds  COMPARISON:  Abdominal ultrasound - 09/15/2014  FINDINGS: Intraoperative angiographic images of the right upper abdominal quadrant during laparoscopic cholecystectomy are provided for review.  Surgical clips overlie the expected location of the gallbladder fossa.  Contrast injection demonstrates selective cannulation of the central aspect of the  cystic duct.  There is passage of contrast through the central aspect of the cystic duct with filling of a non dilated common bile duct. There is passage of contrast though the CBD and into the descending portion of the duodenum.  There is minimal reflux of injected contrast into the common hepatic duct and central aspect of the non dilated intrahepatic biliary system.  There are no discrete filling defects within the opacified portions of the biliary system to suggest the presence of choledocholithiasis.  IMPRESSION: No evidence of choledocholithiasis.   Electronically Signed   By: Simonne Come M.D.   On: 09/18/2014 16:40    Medications: . heparin subcutaneous  5,000 Units Subcutaneous 3 times per day    Assessment/Plan Laparoscopic Cholecystectomy with intraoperative cholangiogram, 09/18/2014, [name], MD  Moderately severe chronic cholecystitis   Pregnancy induced hypertension  after first delivery, post partem  Urinary tract infection  Chlamydia  Gonorrhea  Depression  Acid reflux   Pt has appt for 10/08/14 @ 2:30 arrive at 2:00PM.  I plan to send her home this Am.        LOS: 2 days    Erin Bullock 09/19/2014

## 2014-09-19 NOTE — Progress Notes (Signed)
Patient out ambulating in hallway on own.

## 2014-09-19 NOTE — Progress Notes (Signed)
Patient tolerating diet without nausea. NSL IVF per order.

## 2014-09-19 NOTE — Progress Notes (Signed)
Patient with minimal pain. Voiding well. No c/o nausea. oob as tol.

## 2014-09-20 ENCOUNTER — Encounter (HOSPITAL_COMMUNITY): Payer: Self-pay | Admitting: Surgery

## 2014-09-22 ENCOUNTER — Encounter (HOSPITAL_COMMUNITY): Payer: Self-pay | Admitting: General Surgery

## 2014-09-22 DIAGNOSIS — K219 Gastro-esophageal reflux disease without esophagitis: Secondary | ICD-10-CM

## 2014-09-22 DIAGNOSIS — N390 Urinary tract infection, site not specified: Secondary | ICD-10-CM

## 2014-09-22 DIAGNOSIS — K810 Acute cholecystitis: Secondary | ICD-10-CM

## 2014-09-22 HISTORY — DX: Urinary tract infection, site not specified: N39.0

## 2014-09-22 HISTORY — DX: Acute cholecystitis: K81.0

## 2014-09-22 NOTE — Discharge Summary (Signed)
Physician Discharge Summary  Patient ID: Erin Bullock MRN: [id number] DOB/AGE: 1986/11/10 28 y.o.  Admit date: 09/17/2014 Discharge date: 09/19/2014  Admission Diagnoses:  Moderately severe chronic cholecystitis  Pregnancy induced hypertension  after first delivery, post partem  Urinary tract infection  Chlamydia  Gonorrhea  Depression  Acid reflux   Discharge Diagnoses:  Same Principal Problem:   Cholecystitis, acute Active Problems:   S/P laparoscopic cholecystectomySept2015   UTI (urinary tract infection)   GERD (gastroesophageal reflux disease)   PROCEDURES: Laparoscopic Cholecystectomy with intraoperative cholangiogram, 09/18/2014, [name], MD      Hospital Course: Erin Bullock is a 28 year old healthy female who presents to [facility] with recurrent RUQ abdominal pain. Duration of symptoms is >6 months. On set was gradual. Coarse is worsening. Time pattern is intermittent. Location is RUQ with radiation to the back. Moderate in severity. Characterized as sharp pain. Aggravated by food, particularly fatty foods. Typically starts in the middle of the night. No alleviating factors. Modifying factors include; nexium. She reports being seen in the ED about 5 times this year last being on the 22nd. Her work up shows normal white count, normal LFTs. [place] of abdomen with cholelithiasis, positive murphy's signs without GB wall thickening or pericholecystic fluid. At present time, her pain has improved with IV pain meds. She is NPO. She is otherwise healthy. Denies previous history of abdominal surgeries.  She was admitted and started on antibiotics.  She was taken to the OR the following day and did well.  Her diet was advanced and she was ready for discharge the following day. She was to finish her antibiotics for her UTI and follow up with her PCP. Disposition: 01-Home or Self Care     Medication List         acetaminophen 325 MG tablet  Commonly known as:  TYLENOL  Take 2 tablets  (650 mg total) by mouth every 6 (six) hours as needed (Do not take more than 4000 mg of acetaminophen per day.  It is in your prescribed pain medicine.).     cephALEXin 500 MG capsule  Commonly known as:  KEFLEX  Take 1 capsule (500 mg total) by mouth 2 (two) times daily.     esomeprazole 20 MG capsule  Commonly known as:  NEXIUM  Take 20 mg by mouth daily at 12 noon.     HYDROcodone-acetaminophen 5-325 MG per tablet  Commonly known as:  NORCO/VICODIN  Take 1-2 tablets by mouth every 4 (four) hours as needed.     HYDROcodone-acetaminophen 5-325 MG per tablet  Commonly known as:  NORCO/VICODIN  Take 1-2 tablets by mouth every 4 (four) hours as needed for moderate pain.     ibuprofen 200 MG tablet  Commonly known as:  MOTRIN IB  You may take 2-3 tablets every 6 hours as needed for pain.     ondansetron 4 MG tablet  Commonly known as:  ZOFRAN  Take 1 tablet (4 mg total) by mouth every 6 (six) hours.       Follow-up Information   Follow up with [facility]. Schedule an appointment as soon as possible for a visit on 10/08/2014. (your appointment is on 10/08/14 @ 2:30 arrive at 2:00 for check in.)    Contact information:   [address]   [place] [address] [phone]       Schedule an appointment as soon as possible for a visit with Call PCP. (to follow  up on your Urinary tract infection with Primary care or return to [facility]..  After you finish your antibiotic  course.)       Signed: Havannah Streat 09/22/2014, 2:14 PM

## 2014-10-28 ENCOUNTER — Encounter (HOSPITAL_COMMUNITY): Payer: Self-pay | Admitting: General Surgery

## 2016-10-01 ENCOUNTER — Ambulatory Visit (INDEPENDENT_AMBULATORY_CARE_PROVIDER_SITE_OTHER): Payer: 59 | Admitting: Obstetrics

## 2016-10-01 ENCOUNTER — Encounter: Payer: Self-pay | Admitting: Obstetrics

## 2016-10-01 ENCOUNTER — Encounter: Payer: Self-pay | Admitting: *Deleted

## 2016-10-01 VITALS — BP 118/83 | HR 81 | Temp 99.3°F | Wt 189.8 lb

## 2016-10-01 DIAGNOSIS — N760 Acute vaginitis: Secondary | ICD-10-CM

## 2016-10-01 DIAGNOSIS — Z30433 Encounter for removal and reinsertion of intrauterine contraceptive device: Secondary | ICD-10-CM

## 2016-10-01 DIAGNOSIS — Z113 Encounter for screening for infections with a predominantly sexual mode of transmission: Secondary | ICD-10-CM

## 2016-10-01 MED ORDER — LEVONORGESTREL 20 MCG/24HR IU IUD: INTRAUTERINE_SYSTEM | Freq: Once | INTRAUTERINE | Status: DC

## 2016-10-01 NOTE — Progress Notes (Signed)
Patient is in office for IUD removal and re-insertion.

## 2016-10-01 NOTE — Progress Notes (Signed)
Subjective:    Erin Bullock is a 30 y.o. female who presents for removal and reinsertion of IUD. The patient has no complaints today. The patient is sexually active. Pertinent past medical history: none.  The information documented in the HPI was reviewed and verified.  Menstrual History: OB History    Gravida Para Term Preterm AB Living   4 2 2  0 2 2   SAB TAB Ectopic Multiple Live Births   1 1 0 0 2       No LMP recorded. Patient is not currently having periods (Reason: IUD).   Patient Active Problem List   Diagnosis Date Noted  . Cholecystitis, acute 09/22/2014  . UTI (urinary tract infection) 09/22/2014  . GERD (gastroesophageal reflux disease) 09/22/2014  . S/P laparoscopic cholecystectomySept2015 09/18/2014  . Normal delivery 09/15/2011  . Early or threatened labor 09/14/2011   Past Medical History:  Diagnosis Date  . Acid reflux   . Chlamydia   . Cholecystitis, acute 09/22/2014  . Depression   . Gonorrhea   . Pregnancy induced hypertension    after first delivery, post partem  . Urinary tract infection   . UTI (urinary tract infection) 09/22/2014    Past Surgical History:  Procedure Laterality Date  . CHOLECYSTECTOMY N/A 09/18/2014   Procedure: LAPAROSCOPIC CHOLECYSTECTOMY WITH INTRAOPERATIVE CHOLANGIOGRAM;  Surgeon: [name], MD;  Location: [facility] ORS;  Service: General;  Laterality: N/A;  . INDUCED ABORTION       Current Outpatient Prescriptions:  .  acetaminophen (TYLENOL) 325 MG tablet, Take 2 tablets (650 mg total) by mouth every 6 (six) hours as needed (Do not take more than 4000 mg of acetaminophen per day.  It is in your prescribed pain medicine.). (Patient not taking: Reported on 10/01/2016), Disp: , Rfl:  .  cephALEXin (KEFLEX) 500 MG capsule, Take 1 capsule (500 mg total) by mouth 2 (two) times daily. (Patient not taking: Reported on 10/01/2016), Disp: 10 capsule, Rfl: 0 .  esomeprazole (NEXIUM) 20 MG capsule, Take 20 mg by mouth daily at 12 noon., Disp: ,  Rfl:  .  HYDROcodone-acetaminophen (NORCO/VICODIN) 5-325 MG per tablet, Take 1-2 tablets by mouth every 4 (four) hours as needed. (Patient not taking: Reported on 10/01/2016), Disp: 16 tablet, Rfl: 0 .  HYDROcodone-acetaminophen (NORCO/VICODIN) 5-325 MG per tablet, Take 1-2 tablets by mouth every 4 (four) hours as needed for moderate pain. (Patient not taking: Reported on 10/01/2016), Disp: 30 tablet, Rfl: 0 .  ibuprofen (MOTRIN IB) 200 MG tablet, You may take 2-3 tablets every 6 hours as needed for pain. (Patient not taking: Reported on 10/01/2016), Disp: 30 tablet, Rfl: 0 .  ondansetron (ZOFRAN) 4 MG tablet, Take 1 tablet (4 mg total) by mouth every 6 (six) hours. (Patient not taking: Reported on 10/01/2016), Disp: 12 tablet, Rfl: 0  Current Facility-Administered Medications:  .  levonorgestrel (MIRENA) 20 MCG/24HR IUD, , Intrauterine, Once, Brock Bad, MD No Known Allergies  Social History  Substance Use Topics  . Smoking status: Never Smoker  . Smokeless tobacco: Never Used  . Alcohol use No     Comment: none with preg    Family History  Problem Relation Age of Onset  . Diabetes Mother   . Stroke Mother   . Hypertension Mother   . Cancer Mother   . Hypertension Father   . Cancer Maternal Aunt   . Heart disease Maternal Grandmother        Review of Systems Constitutional: negative for weight loss Genitourinary:negative  for abnormal menstrual periods and vaginal discharge   Objective:   BP 118/83   Pulse 81   Temp 99.3 F (37.4 C) (Oral)   Wt 189 lb 12.8 oz (86.1 kg)   BMI 35.86 kg/m    General:   alert  Skin:   no rash or abnormalities  Lungs:   clear to auscultation bilaterally  Heart:   regular rate and rhythm, S1, S2 normal, no murmur, click, rub or gallop  Breasts:   normal without suspicious masses, skin or nipple changes or axillary nodes  Abdomen:  normal findings: no organomegaly, soft, non-tender and no hernia  Pelvis:  External genitalia: normal  general appearance Urinary system: urethral meatus normal and bladder without fullness, nontender Vaginal: normal without tenderness, induration or masses Cervix: normal appearance.  IUD string grasped with long dressing forceps and removed, intact Adnexa: normal bimanual exam Uterus: anteverted and non-tender, normal size   Lab Review Urine pregnancy test Labs reviewed yes Radiologic studies reviewed no  50% of 25 min visit spent on counseling and coordination of care.   Assessment:    30 y.o., continuing IUD, no contraindications.   Plan:    All questions answered. Chlamydia specimen. Follow up in 6 weeks. GC specimen. Wet prep.  Meds ordered this encounter  Medications  . levonorgestrel (MIRENA) 20 MCG/24HR IUD   No orders of the defined types were placed in this encounter.    IUD Insertion Procedure Note  Pre-operative Diagnosis: Desires removal and reinsertion of IUD  Post-operative Diagnosis: same  Indications: contraception  Procedure Details  Urine pregnancy test was done in office and result was negative.  The risks (including infection, bleeding, pain, and uterine perforation) and benefits of the procedure were explained to the patient and Written informed consent was obtained.    Cervix cleansed with Betadine. Uterus sounded to 8 cm. IUD inserted without difficulty. String visible and trimmed. Patient tolerated procedure well.  IUD Information: Mirena, Lot # [id number], Expiration date 04 / 20.  Condition: Stable  Complications: None  Plan:  The patient was advised to call for any fever or for prolonged or severe pain or bleeding. She was advised to use NSAID as needed for mild to moderate pain.   Attending Physician Documentation: I was present for or participated in the entire procedure, including opening and closing.

## 2016-10-02 ENCOUNTER — Emergency Department (HOSPITAL_BASED_OUTPATIENT_CLINIC_OR_DEPARTMENT_OTHER)
Admission: EM | Admit: 2016-10-02 | Discharge: 2016-10-02 | Disposition: A | Discharge status: Home / Self Care | Payer: 59 | Attending: Emergency Medicine | Admitting: Emergency Medicine

## 2016-10-02 ENCOUNTER — Encounter (HOSPITAL_BASED_OUTPATIENT_CLINIC_OR_DEPARTMENT_OTHER): Payer: Self-pay | Admitting: *Deleted

## 2016-10-02 ENCOUNTER — Emergency Department (HOSPITAL_BASED_OUTPATIENT_CLINIC_OR_DEPARTMENT_OTHER): Payer: 59

## 2016-10-02 DIAGNOSIS — M25561 Pain in right knee: Secondary | ICD-10-CM | POA: Insufficient documentation

## 2016-10-02 MED ORDER — IBUPROFEN 800 MG PO TABS: 800.0000 mg | ORAL_TABLET | Freq: Three times a day (TID) | ORAL | 0 refills | Status: DC | PRN

## 2016-10-02 MED ORDER — KETOROLAC TROMETHAMINE 60 MG/2ML IM SOLN
60.0000 mg | Freq: Once | INTRAMUSCULAR | Status: AC
  Administered 2016-10-02: 60 mg via INTRAMUSCULAR
  Filled 2016-10-02: qty 2

## 2016-10-02 MED ORDER — TRAMADOL HCL 50 MG PO TABS: 50.0000 mg | ORAL_TABLET | Freq: Four times a day (QID) | ORAL | 0 refills | Status: DC | PRN

## 2016-10-02 NOTE — ED Provider Notes (Signed)
[facility] Provider Note   CSN: [id number] Arrival date & time: 10/02/16  0858     History   Chief Complaint Chief Complaint  Patient presents with  . Knee Pain    HPI Erin Bullock is a 30 y.o. female.  HPI Patient presents to the emergency department with right knee pain that started around the first part of August.  Patient states she walks and stands a lot with her job.  She states that that is when she moves or stands a lot and it hurts worse.  Patient states she has taken Tylenol and used BenGay with no relief of her symptoms.  Patient states she has not had any other symptoms such as numbness or weakness of the leg.  The patient does not recall any specific injury or trauma to the leg Past Medical History:  Diagnosis Date  . Acid reflux   . Chlamydia   . Cholecystitis, acute 09/22/2014  . Depression   . Gonorrhea   . Pregnancy induced hypertension    after first delivery, post partem  . Urinary tract infection   . UTI (urinary tract infection) 09/22/2014    Patient Active Problem List   Diagnosis Date Noted  . Cholecystitis, acute 09/22/2014  . UTI (urinary tract infection) 09/22/2014  . GERD (gastroesophageal reflux disease) 09/22/2014  . S/P laparoscopic cholecystectomySept2015 09/18/2014  . Normal delivery 09/15/2011  . Early or threatened labor 09/14/2011    Past Surgical History:  Procedure Laterality Date  . CHOLECYSTECTOMY N/A 09/18/2014   Procedure: LAPAROSCOPIC CHOLECYSTECTOMY WITH INTRAOPERATIVE CHOLANGIOGRAM;  Surgeon: [name], MD;  Location: [facility];  Service: General;  Laterality: N/A;  . INDUCED ABORTION      OB History    Gravida Para Term Preterm AB Living   4 2 2  0 2 2   SAB TAB Ectopic Multiple Live Births   1 1 0 0 2       Home Medications    Prior to Admission medications   Medication Sig Start Date End Date Taking? Authorizing Provider  acetaminophen (TYLENOL) 325 MG tablet Take 2 tablets (650 mg total) by mouth  every 6 (six) hours as needed (Do not take more than 4000 mg of acetaminophen per day.  It is in your prescribed pain medicine.). Patient not taking: Reported on 10/01/2016 09/19/14   Sherrie George, PA-C  cephALEXin (KEFLEX) 500 MG capsule Take 1 capsule (500 mg total) by mouth 2 (two) times daily. Patient not taking: Reported on 10/01/2016 09/15/14   Linwood Dibbles, MD  esomeprazole (NEXIUM) 20 MG capsule Take 20 mg by mouth daily at 12 noon.    Historical Provider, MD  HYDROcodone-acetaminophen (NORCO/VICODIN) 5-325 MG per tablet Take 1-2 tablets by mouth every 4 (four) hours as needed. Patient not taking: Reported on 10/01/2016 09/15/14   Linwood Dibbles, MD  HYDROcodone-acetaminophen (NORCO/VICODIN) 5-325 MG per tablet Take 1-2 tablets by mouth every 4 (four) hours as needed for moderate pain. Patient not taking: Reported on 10/01/2016 09/19/14   Sherrie George, PA-C  ibuprofen (MOTRIN IB) 200 MG tablet You may take 2-3 tablets every 6 hours as needed for pain. Patient not taking: Reported on 10/01/2016 09/19/14   Sherrie George, PA-C  ondansetron (ZOFRAN) 4 MG tablet Take 1 tablet (4 mg total) by mouth every 6 (six) hours. Patient not taking: Reported on 10/01/2016 09/15/14   Linwood Dibbles, MD    Family History Family History  Problem Relation Age of Onset  . Diabetes Mother   .  Stroke Mother   . Hypertension Mother   . Cancer Mother   . Hypertension Father   . Cancer Maternal Aunt   . Heart disease Maternal Grandmother     Social History Social History  Substance Use Topics  . Smoking status: Never Smoker  . Smokeless tobacco: Never Used  . Alcohol use Yes     Comment: several times per year     Allergies   Review of patient's allergies indicates no known allergies.   Review of Systems Review of Systems All other systems negative except as documented in the HPI. All pertinent positives and negatives as reviewed in the HPI.  Physical Exam Updated Vital Signs BP 134/80 (BP Location:  Right Arm)   Pulse 80   Temp 98.4 F (36.9 C) (Oral)   Resp 18   Ht 5\' 1"  (1.549 m)   Wt 78.9 kg   LMP 10/01/2016 Comment: IUD  SpO2 98%   BMI 32.88 kg/m   Physical Exam  Constitutional: She is oriented to person, place, and time. She appears well-developed and well-nourished.  HENT:  Head: Normocephalic and atraumatic.  Eyes: Pupils are equal, round, and reactive to light.  Pulmonary/Chest: Effort normal.  Neurological: She is alert and oriented to person, place, and time.  Skin: Skin is warm and dry. Capillary refill takes less than 2 seconds. No erythema. No pallor.  Psychiatric: She has a normal mood and affect.  Nursing note and vitals reviewed.    ED Treatments / Results  Labs (all labs ordered are listed, but only abnormal results are displayed) Labs Reviewed - No data to display  EKG  EKG Interpretation None       Radiology Dg Knee Complete 4 Views Right  Result Date: 10/02/2016 CLINICAL DATA:  Chronic anterior right knee pain at the level of the patella. EXAM: RIGHT KNEE - COMPLETE 4+ VIEW COMPARISON:  None. FINDINGS: No evidence of fracture, dislocation, or joint effusion. No evidence of arthropathy or other focal bone abnormality. Soft tissues are unremarkable. IMPRESSION: Negative. Electronically Signed   By: Irish LackGlenn  Yamagata M.D.   On: 10/02/2016 11:06    Procedures Procedures (including critical care time)  Medications Ordered in ED Medications  ketorolac (TORADOL) injection 60 mg (not administered)     Initial Impression / Assessment and Plan / ED Course  I have reviewed the triage vital signs and the nursing notes.  Pertinent labs & imaging results that were available during my care of the patient were reviewed by me and considered in my medical decision making (see chart for details).  Clinical Course    Patient be referred to orthopedics.  Told ice and elevate her knee.  Told to return here as needed.  Patient agrees the plan.  All  questions were answered  Final Clinical Impressions(s) / ED Diagnoses   Final diagnoses:  None    New Prescriptions New Prescriptions   No medications on file     Charlestine NightChristopher Eulogia Dismore, PA-C 10/02/16 1115    Charlestine Nighthristopher Ashe Gago, PA-C 10/02/16 1117    Pricilla LovelessScott Goldston, MD 10/02/16 1230

## 2016-10-02 NOTE — ED Notes (Signed)
Pt refused ice pack due to not getting relief at home from ice therapy.

## 2016-10-02 NOTE — ED Triage Notes (Signed)
Pt reports constant, dull, R knee pain since early August. Denies known injury, numbness/tingling. Reports taking 1000mg  Tylenol around 2000 last night and 1500mg  Tylenol around midnight with no relief. Denies other med use.

## 2016-10-02 NOTE — Discharge Instructions (Signed)
The x-rays were normal.  Follow-up with the orthopedist provided.  Ice and elevate your knee

## 2016-10-04 ENCOUNTER — Other Ambulatory Visit: Payer: Self-pay | Admitting: Obstetrics

## 2016-10-04 DIAGNOSIS — A5901 Trichomonal vulvovaginitis: Secondary | ICD-10-CM

## 2016-10-04 LAB — CERVICOVAGINAL ANCILLARY ONLY
Chlamydia: NEGATIVE
Neisseria Gonorrhea: NEGATIVE
TRICH (WINDOWPATH): POSITIVE — AB

## 2016-10-04 MED ORDER — METRONIDAZOLE 500 MG PO TABS: 2000.0000 mg | ORAL_TABLET | Freq: Once | ORAL | 0 refills | Status: AC

## 2016-10-06 ENCOUNTER — Other Ambulatory Visit: Payer: Self-pay | Admitting: Obstetrics

## 2016-10-06 DIAGNOSIS — B9689 Other specified bacterial agents as the cause of diseases classified elsewhere: Secondary | ICD-10-CM

## 2016-10-06 DIAGNOSIS — N760 Acute vaginitis: Principal | ICD-10-CM

## 2016-10-06 LAB — CERVICOVAGINAL ANCILLARY ONLY: CANDIDA VAGINITIS: NEGATIVE

## 2016-10-06 MED ORDER — METRONIDAZOLE 500 MG PO TABS: 500.0000 mg | ORAL_TABLET | Freq: Two times a day (BID) | ORAL | 2 refills | Status: DC

## 2016-11-11 ENCOUNTER — Ambulatory Visit: Payer: Self-pay | Admitting: Obstetrics

## 2018-01-11 ENCOUNTER — Emergency Department (HOSPITAL_BASED_OUTPATIENT_CLINIC_OR_DEPARTMENT_OTHER)
Admission: EM | Admit: 2018-01-11 | Discharge: 2018-01-11 | Disposition: A | Discharge status: Home / Self Care | Payer: 59 | Attending: Physician Assistant | Admitting: Physician Assistant

## 2018-01-11 ENCOUNTER — Other Ambulatory Visit: Payer: Self-pay

## 2018-01-11 ENCOUNTER — Encounter (HOSPITAL_BASED_OUTPATIENT_CLINIC_OR_DEPARTMENT_OTHER): Payer: Self-pay

## 2018-01-11 DIAGNOSIS — Z79899 Other long term (current) drug therapy: Secondary | ICD-10-CM | POA: Insufficient documentation

## 2018-01-11 DIAGNOSIS — G43809 Other migraine, not intractable, without status migrainosus: Secondary | ICD-10-CM | POA: Insufficient documentation

## 2018-01-11 MED ORDER — SODIUM CHLORIDE 0.9 % IV BOLUS (SEPSIS)
1000.0000 mL | Freq: Once | INTRAVENOUS | Status: AC
  Administered 2018-01-11: 1000 mL via INTRAVENOUS

## 2018-01-11 MED ORDER — PROCHLORPERAZINE EDISYLATE 5 MG/ML IJ SOLN
10.0000 mg | Freq: Once | INTRAMUSCULAR | Status: AC
  Administered 2018-01-11: 10 mg via INTRAVENOUS
  Filled 2018-01-11: qty 2

## 2018-01-11 MED ORDER — DIPHENHYDRAMINE HCL 50 MG/ML IJ SOLN
25.0000 mg | Freq: Once | INTRAMUSCULAR | Status: AC
  Administered 2018-01-11: 25 mg via INTRAVENOUS
  Filled 2018-01-11: qty 1

## 2018-01-11 MED ORDER — DEXAMETHASONE 10 MG/ML FOR PEDIATRIC ORAL USE
10.0000 mg | Freq: Once | INTRAMUSCULAR | Status: AC
  Administered 2018-01-11: 10 mg via ORAL
  Filled 2018-01-11: qty 1

## 2018-01-11 NOTE — Discharge Instructions (Signed)
You have been treated for a migraine in the ED.  Make sure that you follow-up with your primary care doctor for further workup if headaches return and do not resolve.  Return to the ED with any worsening symptoms.  May take over-the-counter Motrin or Excedrin for your migraine headaches.

## 2018-01-11 NOTE — ED Provider Notes (Signed)
[facility] EMERGENCY DEPARTMENT Provider Note   CSN: [id number] Arrival date & time: 01/11/18  1403     History   Chief Complaint Chief Complaint  Patient presents with  . Migraine    HPI Erin Bullock is a 32 y.o. female.  HPI 32 year old AA female with no pertinent past medical history presents to the emergency department today with complaints of a migraine headache.  Patient states her headache started yesterday.  States it was gradual in onset and has progressively worsened.  Denies any red flag symptoms including max intensity or onset.  Patient reports that it is a pressure around her entire head.  She reports associated photophobia and nausea.  Denies any associated lightheadedness, dizziness, visual changes, neck pain, fevers, chills, chest pain or shortness of breath.  She has tried migraine Excedrin medication over-the-counter with only little relief.  Nothing makes her symptoms better.  History of migraines as a child but no recent history of migraines.  Patient states that she has the occasional headache that is treated with Motrin and Tylenol.  Denies  any recent illness.  Pt denies any fever, chill, vision changes, lightheadedness, dizziness, congestion, neck pain, cp, sob, cough, abd pain, n/v/d, urinary symptoms, change in bowel habits, melena, hematochezia, lower extremity paresthesias.  Past Medical History:  Diagnosis Date  . Acid reflux   . Chlamydia   . Cholecystitis, acute 09/22/2014  . Depression   . Gonorrhea   . Pregnancy induced hypertension    after first delivery, post partem  . Urinary tract infection   . UTI (urinary tract infection) 09/22/2014    Patient Active Problem List   Diagnosis Date Noted  . Cholecystitis, acute 09/22/2014  . UTI (urinary tract infection) 09/22/2014  . GERD (gastroesophageal reflux disease) 09/22/2014  . S/P laparoscopic cholecystectomySept2015 09/18/2014  . Normal delivery 09/15/2011  . Early or threatened  labor 09/14/2011    Past Surgical History:  Procedure Laterality Date  . CHOLECYSTECTOMY N/A 09/18/2014   Procedure: LAPAROSCOPIC CHOLECYSTECTOMY WITH INTRAOPERATIVE CHOLANGIOGRAM;  Surgeon: [name], MD;  Location: [facility];  Service: General;  Laterality: N/A;  . INDUCED ABORTION      OB History    Gravida Para Term Preterm AB Living   4 2 2  0 2 2   SAB TAB Ectopic Multiple Live Births   1 1 0 0 2       Home Medications    Prior to Admission medications   Medication Sig Start Date End Date Taking? Authorizing Provider  acetaminophen (TYLENOL) 325 MG tablet Take 2 tablets (650 mg total) by mouth every 6 (six) hours as needed (Do not take more than 4000 mg of acetaminophen per day.  It is in your prescribed pain medicine.). Patient not taking: Reported on 10/01/2016 09/19/14   Sherrie George, PA-C  cephALEXin [facility]) 500 MG capsule Take 1 capsule (500 mg total) by mouth 2 (two) times daily. Patient not taking: Reported on 10/01/2016 09/15/14   Linwood Dibbles, MD  esomeprazole (NEXIUM) 20 MG capsule Take 20 mg by mouth daily at 12 noon.    [provider]  HYDROcodone-acetaminophen (NORCO/VICODIN) 5-325 MG per tablet Take 1-2 tablets by mouth every 4 (four) hours as needed. Patient not taking: Reported on 10/01/2016 09/15/14   Linwood Dibbles, MD  HYDROcodone-acetaminophen (NORCO/VICODIN) 5-325 MG per tablet Take 1-2 tablets by mouth every 4 (four) hours as needed for moderate pain. Patient not taking: Reported on 10/01/2016 09/19/14   Sherrie George, PA-C  ibuprofen (ADVIL,MOTRIN)  800 MG tablet Take 1 tablet (800 mg total) by mouth every 8 (eight) hours as needed. 10/02/16   Lawyer, Cristal Deerhristopher, PA-C  metroNIDAZOLE (FLAGYL) 500 MG tablet Take 1 tablet (500 mg total) by mouth 2 (two) times daily. 10/06/16   Brock BadHarper, Charles A, MD  ondansetron (ZOFRAN) 4 MG tablet Take 1 tablet (4 mg total) by mouth every 6 (six) hours. Patient not taking: Reported on 10/01/2016 09/15/14   Linwood DibblesKnapp, Jon, MD    traMADol (ULTRAM) 50 MG tablet Take 1 tablet (50 mg total) by mouth every 6 (six) hours as needed for severe pain. 10/02/16   Charlestine NightLawyer, Christopher, PA-C    Family History Family History  Problem Relation Age of Onset  . Diabetes Mother   . Stroke Mother   . Hypertension Mother   . Cancer Mother   . Hypertension Father   . Cancer Maternal Aunt   . Heart disease Maternal Grandmother     Social History Social History   Tobacco Use  . Smoking status: Never Smoker  . Smokeless tobacco: Never Used  Substance Use Topics  . Alcohol use: Yes    Comment: occ  . Drug use: Yes    Types: Marijuana     Allergies   Patient has no known allergies.   Review of Systems Review of Systems  Constitutional: Negative for chills and fever.  HENT: Negative for congestion.   Eyes: Negative for visual disturbance.  Respiratory: Negative for cough and shortness of breath.   Cardiovascular: Negative for chest pain.  Gastrointestinal: Negative for abdominal pain, diarrhea, nausea and vomiting.  Genitourinary: Negative for dysuria, flank pain, frequency, hematuria and urgency.  Musculoskeletal: Negative for arthralgias and myalgias.  Skin: Negative for rash.  Neurological: Positive for headaches. Negative for dizziness, syncope, weakness, light-headedness and numbness.  Psychiatric/Behavioral: Negative for sleep disturbance. The patient is not nervous/anxious.      Physical Exam Updated Vital Signs BP 123/81 (BP Location: Left Arm)   Pulse 71   Temp 98.2 F (36.8 C) (Oral)   Resp 18   Ht 5' (1.524 m)   Wt 85.7 kg (189 lb)   LMP 12/10/2017   SpO2 98%   BMI 36.91 kg/m   Physical Exam  Constitutional: She is oriented to person, place, and time. She appears well-developed and well-nourished.  Non-toxic appearance. No distress.  HENT:  Head: Normocephalic and atraumatic.  Nose: Nose normal.  Mouth/Throat: Oropharynx is clear and moist.  Eyes: Conjunctivae are normal. Pupils are  equal, round, and reactive to light. Right eye exhibits no discharge. Left eye exhibits no discharge.  Neck: Normal range of motion. Neck supple.  No c spine midline tenderness. No paraspinal tenderness. No deformities or step offs noted. Full ROM. Supple. No nuchal rigidity.    Cardiovascular: Normal rate, regular rhythm and normal heart sounds. Exam reveals no gallop and no friction rub.  No murmur heard. Pulmonary/Chest: Effort normal. No respiratory distress.  Abdominal: Soft.  Musculoskeletal: Normal range of motion. She exhibits no tenderness.  Lymphadenopathy:    She has no cervical adenopathy.  Neurological: She is alert and oriented to person, place, and time.  The patient is alert, attentive, and oriented x 3. Speech is clear. Cranial nerve II-VII grossly intact. Negative pronator drift. Sensation intact. Strength 5/5 in all extremities. Reflexes 2+ and symmetric at biceps, triceps, knees, and ankles. Rapid alternating movement and fine finger movements intact. Romberg is absent. Posture and gait normal.   Skin: Skin is warm and dry. Capillary refill  takes less than 2 seconds.  Psychiatric: Her behavior is normal. Judgment and thought content normal.  Nursing note and vitals reviewed.    ED Treatments / Results  Labs (all labs ordered are listed, but only abnormal results are displayed) Labs Reviewed - No data to display  EKG  EKG Interpretation None       Radiology No results found.  Procedures Procedures (including critical care time)  Medications Ordered in ED Medications  prochlorperazine (COMPAZINE) injection 10 mg (10 mg Intravenous Given 01/11/18 1541)  diphenhydrAMINE (BENADRYL) injection 25 mg (25 mg Intravenous Given 01/11/18 1541)  sodium chloride 0.9 % bolus 1,000 mL (1,000 mLs Intravenous New Bag/Given 01/11/18 1541)  dexamethasone (DECADRON) 10 MG/ML injection for Pediatric ORAL use 10 mg (10 mg Oral Given 01/11/18 1541)     Initial Impression /  Assessment and Plan / ED Course  I have reviewed the triage vital signs and the nursing notes.  Pertinent labs & imaging results that were available during my care of the patient were reviewed by me and considered in my medical decision making (see chart for details).     Patient presents to the ED with what she is calling a migraine headache.  History of migraines as a child but no recent migraines.  Pt HA treated and improved while in ED with migraine cocktail.  Presentation is non concerning for [facility], ICH, Meningitis, or temporal arteritis. Pt is afebrile with no focal neuro deficits, nuchal rigidity, or change in vision. Pt is to follow up with PCP to discuss prophylactic medication. Pt verbalizes understanding and is agreeable with plan to dc.    Final Clinical Impressions(s) / ED Diagnoses   Final diagnoses:  Other migraine without status migrainosus, not intractable    ED Discharge Orders    None       Wallace Keller 01/11/18 1747    Abelino Derrick, MD 01/13/18 1650

## 2018-01-11 NOTE — ED Triage Notes (Signed)
C/o migraine HA started yesterday-NAD-steady gait

## 2018-09-04 ENCOUNTER — Other Ambulatory Visit: Payer: Self-pay

## 2018-09-04 ENCOUNTER — Encounter (HOSPITAL_BASED_OUTPATIENT_CLINIC_OR_DEPARTMENT_OTHER): Payer: Self-pay | Admitting: *Deleted

## 2018-09-04 ENCOUNTER — Emergency Department (HOSPITAL_BASED_OUTPATIENT_CLINIC_OR_DEPARTMENT_OTHER)
Admission: EM | Admit: 2018-09-04 | Discharge: 2018-09-04 | Disposition: A | Discharge status: Home / Self Care | Payer: 59 | Attending: Emergency Medicine | Admitting: Emergency Medicine

## 2018-09-04 DIAGNOSIS — F1210 Cannabis abuse, uncomplicated: Secondary | ICD-10-CM | POA: Diagnosis not present

## 2018-09-04 DIAGNOSIS — Z79899 Other long term (current) drug therapy: Secondary | ICD-10-CM | POA: Insufficient documentation

## 2018-09-04 DIAGNOSIS — R519 Headache, unspecified: Secondary | ICD-10-CM

## 2018-09-04 DIAGNOSIS — R51 Headache: Secondary | ICD-10-CM | POA: Insufficient documentation

## 2018-09-04 LAB — PREGNANCY, URINE: Preg Test, Ur: NEGATIVE

## 2018-09-04 MED ORDER — KETOROLAC TROMETHAMINE 15 MG/ML IJ SOLN
15.0000 mg | Freq: Once | INTRAMUSCULAR | Status: AC
  Administered 2018-09-04: 15 mg via INTRAVENOUS
  Filled 2018-09-04: qty 1

## 2018-09-04 MED ORDER — SODIUM CHLORIDE 0.9 % IV SOLN: INTRAVENOUS | Status: DC

## 2018-09-04 MED ORDER — SODIUM CHLORIDE 0.9 % IV BOLUS
1000.0000 mL | Freq: Once | INTRAVENOUS | Status: AC
  Administered 2018-09-04: 1000 mL via INTRAVENOUS

## 2018-09-04 MED ORDER — NAPROXEN 375 MG PO TABS: 375.0000 mg | ORAL_TABLET | Freq: Two times a day (BID) | ORAL | 0 refills | Status: DC

## 2018-09-04 MED ORDER — PROCHLORPERAZINE EDISYLATE 10 MG/2ML IJ SOLN
10.0000 mg | Freq: Once | INTRAMUSCULAR | Status: AC
  Administered 2018-09-04: 10 mg via INTRAVENOUS
  Filled 2018-09-04: qty 2

## 2018-09-04 NOTE — ED Provider Notes (Signed)
[facility] EMERGENCY DEPARTMENT Provider Note   CSN: [id number] Arrival date & time: 09/04/18  1311     History   Chief Complaint Chief Complaint  Patient presents with  . Headache    HPI Erin Bullock is a 32 y.o. female with a history of depression who presents to the ED with complaints of headache that started last night. Headache is described as throbbing and frontal. Had steady onset, gradual progression, and has been constant. Current pain is an 8/10 in severity, worse with light, no alleviating factors, no interventions PTA. Hx of similar headaches which have improved with migraine cocktails in the ER. Denies numbness, weakness, dizziness, change in vision, fever, chills, or neck stiffness. Denies head trauma.    HPI  Past Medical History:  Diagnosis Date  . Acid reflux   . Chlamydia   . Cholecystitis, acute 09/22/2014  . Depression   . Gonorrhea   . Pregnancy induced hypertension    after first delivery, post partem  . Urinary tract infection   . UTI (urinary tract infection) 09/22/2014    Patient Active Problem List   Diagnosis Date Noted  . Cholecystitis, acute 09/22/2014  . UTI (urinary tract infection) 09/22/2014  . GERD (gastroesophageal reflux disease) 09/22/2014  . S/P laparoscopic cholecystectomySept2015 09/18/2014  . Normal delivery 09/15/2011  . Early or threatened labor 09/14/2011    Past Surgical History:  Procedure Laterality Date  . CHOLECYSTECTOMY N/A 09/18/2014   Procedure: LAPAROSCOPIC CHOLECYSTECTOMY WITH INTRAOPERATIVE CHOLANGIOGRAM;  Surgeon: [name], MD;  Location: [facility];  Service: General;  Laterality: N/A;  . INDUCED ABORTION       OB History    Gravida  4   Para  2   Term  2   Preterm  0   AB  2   Living  2     SAB  1   TAB  1   Ectopic  0   Multiple  0   Live Births  2            Home Medications    Prior to Admission medications   Medication Sig Start Date End Date Taking? Authorizing  Provider  acetaminophen (TYLENOL) 325 MG tablet Take 2 tablets (650 mg total) by mouth every 6 (six) hours as needed (Do not take more than 4000 mg of acetaminophen per day.  It is in your prescribed pain medicine.). Patient not taking: Reported on 10/01/2016 09/19/14   Sherrie George, PA-C  cephALEXin [facility]) 500 MG capsule Take 1 capsule (500 mg total) by mouth 2 (two) times daily. Patient not taking: Reported on 10/01/2016 09/15/14   Linwood Dibbles, MD  esomeprazole (NEXIUM) 20 MG capsule Take 20 mg by mouth daily at 12 noon.    [provider]  HYDROcodone-acetaminophen (NORCO/VICODIN) 5-325 MG per tablet Take 1-2 tablets by mouth every 4 (four) hours as needed. Patient not taking: Reported on 10/01/2016 09/15/14   Linwood Dibbles, MD  HYDROcodone-acetaminophen (NORCO/VICODIN) 5-325 MG per tablet Take 1-2 tablets by mouth every 4 (four) hours as needed for moderate pain. Patient not taking: Reported on 10/01/2016 09/19/14   Sherrie George, PA-C  ibuprofen (ADVIL,MOTRIN) 800 MG tablet Take 1 tablet (800 mg total) by mouth every 8 (eight) hours as needed. 10/02/16   Lawyer, Cristal Deer, PA-C  metroNIDAZOLE (FLAGYL) 500 MG tablet Take 1 tablet (500 mg total) by mouth 2 (two) times daily. 10/06/16   Brock Bad, MD  ondansetron (ZOFRAN) 4 MG tablet Take 1 tablet (  4 mg total) by mouth every 6 (six) hours. Patient not taking: Reported on 10/01/2016 09/15/14   Linwood Dibbles, MD  traMADol (ULTRAM) 50 MG tablet Take 1 tablet (50 mg total) by mouth every 6 (six) hours as needed for severe pain. 10/02/16   Charlestine Night, PA-C    Family History Family History  Problem Relation Age of Onset  . Diabetes Mother   . Stroke Mother   . Hypertension Mother   . Cancer Mother   . Hypertension Father   . Cancer Maternal Aunt   . Heart disease Maternal Grandmother     Social History Social History   Tobacco Use  . Smoking status: Never Smoker  . Smokeless tobacco: Never Used  Substance Use Topics   . Alcohol use: Yes    Comment: occ  . Drug use: Yes    Types: Marijuana     Allergies   Patient has no known allergies.   Review of Systems Review of Systems  Constitutional: Negative for chills and fever.  Eyes: Negative for visual disturbance.  Gastrointestinal: Negative for nausea and vomiting.  Musculoskeletal: Negative for neck pain.  Neurological: Positive for headaches. Negative for dizziness, tremors, seizures, syncope, facial asymmetry, speech difficulty, weakness and numbness.  All other systems reviewed and are negative.    Physical Exam Updated Vital Signs BP 132/87 (BP Location: Left Arm)   Pulse 72   Temp 98.5 F (36.9 C) (Oral)   Resp 20   Ht 5' (1.524 m)   Wt 77.1 kg   LMP 08/03/2018   SpO2 99%   BMI 33.20 kg/m   Physical Exam  Constitutional: She appears well-developed and well-nourished.  Non-toxic appearance. No distress.  HENT:  Head: Normocephalic and atraumatic.  Eyes: Pupils are equal, round, and reactive to light. Conjunctivae and EOM are normal. Right eye exhibits no discharge. Left eye exhibits no discharge.  No proptosis.   Neck: Neck supple.  Cardiovascular: Normal rate and regular rhythm.  Pulmonary/Chest: Effort normal and breath sounds normal. No respiratory distress. She has no wheezes. She has no rhonchi. She has no rales.  Respiration even and unlabored  Abdominal: Soft. She exhibits no distension. There is no tenderness.  Neurological: She is alert.  Alert. Clear speech. No facial droop. CNIII-XII grossly intact. Bilateral upper and lower extremities' sensation grossly intact. 5/5 symmetric strength with grip strength and with plantar and dorsi flexion bilaterally. Normal finger to nose bilaterally. Negative pronator drift. Negative Romberg sign. Gait is steady and intact .   Skin: Skin is warm and dry. No rash noted.  Psychiatric: She has a normal mood and affect. Her behavior is normal.  Nursing note and vitals reviewed.  ED  Treatments / Results  Labs Results for orders placed or performed during the hospital encounter of 09/04/18  Pregnancy, urine  Result Value Ref Range   Preg Test, Ur NEGATIVE NEGATIVE   No results found.  EKG None  Radiology No results found.  Procedures Procedures (including critical care time)  Medications Ordered in ED Medications  sodium chloride 0.9 % bolus 1,000 mL (has no administration in time range)    And  0.9 %  sodium chloride infusion (has no administration in time range)  ketorolac (TORADOL) 15 MG/ML injection 15 mg (has no administration in time range)  prochlorperazine (COMPAZINE) injection 10 mg (has no administration in time range)   Initial Impression / Assessment and Plan / ED Course  I have reviewed the triage vital signs and the nursing  notes.  Pertinent labs & imaging results that were available during my care of the patient were reviewed by me and considered in my medical decision making (see chart for details).    Patient presents with complaint of headache. Patient is nontoxic appearing, vitals WNL. Patient has hx of similar headaches, gradual onset with steady progression in severity- non concerning for [facility], ICH, ischemic CVA, dural venous sinus thrombosis, acute glaucoma, giant cell arteritis, mass, or meningitis. Pt is afebrile with no focal neuro deficits, dizziness, change in vision, proptosis, or nuchal rigidity. Patient treated for headache with migraine cocktail with improvement. I discussed treatment plan, need for PCP follow-up, and return precautions with the patient. Provided opportunity for questions, patient confirmed understanding and is in agreement with plan.    Final Clinical Impressions(s) / ED Diagnoses   Final diagnoses:  Acute nonintractable headache, unspecified headache type    ED Discharge Orders         Ordered    naproxen (NAPROSYN) 375 MG tablet  2 times daily     09/04/18 [address], [place] R,  PA-C 09/04/18 1516    Jacalyn Lefevre, MD 09/04/18 1523

## 2018-09-04 NOTE — ED Triage Notes (Signed)
Headache since last night. No fever.

## 2018-09-04 NOTE — Discharge Instructions (Addendum)
You were seen in the emergency department today for a headache.  Her headache improved following a migraine cocktail in the emergency department.  We are sending you home with a prescription for naproxen should the headache return. - Naproxen is a nonsteroidal anti-inflammatory medication that will help with pain and swelling. Be sure to take this medication as prescribed with food, 1 pill every 12 hours,  It should be taken with food, as it can cause stomach upset, and more seriously, stomach bleeding. Do not take other nonsteroidal anti-inflammatory medications with this such as Advil, Motrin, Aleve, Mobic, Goodie Powder, or Motrin.  Given the medicine he received here wait until tomorrow morning to take this for the first time.  You make take Tylenol per over the counter dosing with these medications.   We have prescribed you new medication(s) today. Discuss the medications prescribed today with your pharmacist as they can have adverse effects and interactions with your other medicines including over the counter and prescribed medications. Seek medical evaluation if you start to experience new or abnormal symptoms after taking one of these medicines, seek care immediately if you start to experience difficulty breathing, feeling of your throat closing, facial swelling, or rash as these could be indications of a more serious allergic reaction  Please follow-up with primary care provider within 1 week for reevaluation.  Return to the ER for new or worsening symptoms or any other concerns.  If you do not have a primary care provider please call the phone number circled in your discharge instructions to help facilitate primary care follow-up.

## 2018-12-20 ENCOUNTER — Other Ambulatory Visit: Payer: Self-pay

## 2018-12-20 ENCOUNTER — Encounter (HOSPITAL_COMMUNITY): Payer: Self-pay | Admitting: *Deleted

## 2018-12-20 ENCOUNTER — Inpatient Hospital Stay (HOSPITAL_COMMUNITY)
Admission: AD | Admit: 2018-12-20 | Discharge: 2018-12-20 | Disposition: A | Discharge status: Home / Self Care | Payer: 59 | Source: Ambulatory Visit | Attending: Obstetrics & Gynecology | Admitting: Obstetrics & Gynecology

## 2018-12-20 DIAGNOSIS — O219 Vomiting of pregnancy, unspecified: Secondary | ICD-10-CM

## 2018-12-20 DIAGNOSIS — K5900 Constipation, unspecified: Secondary | ICD-10-CM | POA: Diagnosis not present

## 2018-12-20 DIAGNOSIS — O210 Mild hyperemesis gravidarum: Secondary | ICD-10-CM | POA: Diagnosis not present

## 2018-12-20 DIAGNOSIS — O99611 Diseases of the digestive system complicating pregnancy, first trimester: Secondary | ICD-10-CM

## 2018-12-20 DIAGNOSIS — O26891 Other specified pregnancy related conditions, first trimester: Secondary | ICD-10-CM | POA: Diagnosis not present

## 2018-12-20 DIAGNOSIS — Z3A11 11 weeks gestation of pregnancy: Secondary | ICD-10-CM | POA: Diagnosis not present

## 2018-12-20 LAB — URINALYSIS, ROUTINE W REFLEX MICROSCOPIC
Bilirubin Urine: NEGATIVE
Glucose, UA: NEGATIVE mg/dL
Ketones, ur: NEGATIVE mg/dL
Leukocytes, UA: NEGATIVE
Nitrite: NEGATIVE
PH: 7 (ref 5.0–8.0)
Protein, ur: NEGATIVE mg/dL
SPECIFIC GRAVITY, URINE: 1.01 (ref 1.005–1.030)

## 2018-12-20 LAB — POCT PREGNANCY, URINE: Preg Test, Ur: POSITIVE — AB

## 2018-12-20 MED ORDER — POLYETHYLENE GLYCOL 3350 17 GM/SCOOP PO POWD: 17.0000 g | Freq: Two times a day (BID) | ORAL | 2 refills | Status: AC | PRN

## 2018-12-20 MED ORDER — MECLIZINE HCL 25 MG PO TABS: 25.0000 mg | ORAL_TABLET | Freq: Three times a day (TID) | ORAL | 2 refills | Status: AC | PRN

## 2018-12-20 NOTE — Discharge Instructions (Signed)

## 2018-12-20 NOTE — MAU Note (Signed)
Is constipated, has taken a stool softener, laxative, child's suppository,  drunk prune juice, increased fluid and fiber intact.  Has been over a wk.  Feels like it is there, just won't come out.

## 2018-12-20 NOTE — MAU Provider Note (Signed)
Chief Complaint: Fecal Impaction   First Provider Initiated Contact with Patient 12/20/18 1829     SUBJECTIVE HPI: Erin Bullock is a 32 y.o. [id number] at 1027w0d who presents [facility] reporting severe constipation with no bowel movement since 12/08/2018.  Has tried stool softeners, laxatives, prune juice, suppositories without relief.  Has been taking Zofran once a day for nausea and vomiting of pregnancy.  Stopped taking it on 12/18/2018.  Still having frequent nausea, motion sickness and occasional vomiting.  Prenatal care [facility].  Associated signs and symptoms: Negative for abdominal pain, vaginal bleeding.  Past Medical History:  Diagnosis Date  . Acid reflux   . Chlamydia   . Cholecystitis, acute 09/22/2014  . Depression   . Gonorrhea   . Pregnancy induced hypertension    after first delivery, post partem  . Urinary tract infection   . UTI (urinary tract infection) 09/22/2014   OB History  Gravida Para Term Preterm AB Living  5 2 2  0 2 2  SAB TAB Ectopic Multiple Live Births  1 1 0 0 2    # Outcome Date GA Lbr Len/2nd Weight Sex Delivery Anes PTL Lv  5 Current           4 Term 09/14/11 4254w0d 11:06 / 00:24 3260 g M Vag-Spont EPI  LIV  3 Term 06/02/10    M Vag-Spont EPI N LIV     Birth Comments: elevated BP after delivery  2 TAB           1 SAB            Past Surgical History:  Procedure Laterality Date  . CHOLECYSTECTOMY N/A 09/18/2014   Procedure: LAPAROSCOPIC CHOLECYSTECTOMY WITH INTRAOPERATIVE CHOLANGIOGRAM;  Surgeon: Wenda LowMatt Martin, MD;  Location: [facility];  Service: General;  Laterality: N/A;  . INDUCED ABORTION     Social History   Socioeconomic History  . Marital status: Single    Spouse name: Not on file  . Number of children: Not on file  . Years of education: Not on file  . Highest education level: Not on file  Occupational History  . Not on file  Social Needs  . Financial resource strain: Not on file  . Food insecurity:     Worry: Not on file    Inability: Not on file  . Transportation needs:    Medical: Not on file    Non-medical: Not on file  Tobacco Use  . Smoking status: Never Smoker  . Smokeless tobacco: Never Used  Substance and Sexual Activity  . Alcohol use: Not Currently    Comment: occ  . Drug use: Not Currently    Types: Marijuana  . Sexual activity: Not Currently    Birth control/protection: None  Lifestyle  . Physical activity:    Days per week: Not on file    Minutes per session: Not on file  . Stress: Not on file  Relationships  . Social connections:    Talks on phone: Not on file    Gets together: Not on file    Attends religious service: Not on file    Active member of club or organization: Not on file    Attends meetings of clubs or organizations: Not on file    Relationship status: Not on file  . Intimate partner violence:    Fear of current or ex partner: Not on file    Emotionally abused: Not on file    Physically  abused: Not on file    Forced sexual activity: Not on file  Other Topics Concern  . Not on file  Social History Narrative  . Not on file   Family History  Problem Relation Age of Onset  . Diabetes Mother   . Stroke Mother   . Hypertension Mother   . Cancer Mother   . Hypertension Father   . Cancer Maternal Aunt   . Heart disease Maternal Grandmother    No current facility-administered medications on file prior to encounter.    Current Outpatient Medications on File Prior to Encounter  Medication Sig Dispense Refill  . ondansetron (ZOFRAN-ODT) 4 MG disintegrating tablet Take 4 mg by mouth every 8 (eight) hours as needed for nausea or vomiting.    . Prenatal Vit-Fe Fumarate-FA (PRENATAL MULTIVITAMIN) TABS tablet Take 1 tablet by mouth daily at 12 noon.    Marland Kitchen esomeprazole (NEXIUM) 20 MG capsule Take 20 mg by mouth daily at 12 noon.    . naproxen (NAPROSYN) 375 MG tablet Take 1 tablet (375 mg total) by mouth 2 (two) times daily. 20 tablet 0   No Known  Allergies  I have reviewed patient's Past Medical Hx, Surgical Hx, Family Hx, Social Hx, medications and allergies.   Review of Systems  Constitutional: Negative for appetite change, chills and fever.  Gastrointestinal: Positive for constipation, nausea and vomiting. Negative for abdominal pain, blood in stool and diarrhea.  Genitourinary: Negative for vaginal bleeding.    OBJECTIVE Patient Vitals for the past 24 hrs:  BP Temp Temp src Pulse Resp SpO2 Weight  12/20/18 1644 128/80 98 F (36.7 C) Oral 91 16 99 % 80.1 kg   Constitutional: Well-developed, well-nourished female in mild distress.  Cardiovascular: normal rate Respiratory: normal rate and effort.  GI: Abd soft, non-tender. Neurologic: Alert and oriented x 4.  GU: Deferred    LAB RESULTS Results for orders placed or performed during the hospital encounter of 12/20/18 (from the past 24 hour(s))  Urinalysis, Routine w reflex microscopic     Status: Abnormal   Collection Time: 12/20/18  5:40 PM  Result Value Ref Range   Color, Urine YELLOW YELLOW   APPearance CLEAR CLEAR   Specific Gravity, Urine 1.010 1.005 - 1.030   pH 7.0 5.0 - 8.0   Glucose, UA NEGATIVE NEGATIVE mg/dL   Hgb urine dipstick SMALL (A) NEGATIVE   Bilirubin Urine NEGATIVE NEGATIVE   Ketones, ur NEGATIVE NEGATIVE mg/dL   Protein, ur NEGATIVE NEGATIVE mg/dL   Nitrite NEGATIVE NEGATIVE   Leukocytes, UA NEGATIVE NEGATIVE   RBC / HPF 0-5 0 - 5 RBC/hpf   WBC, UA 0-5 0 - 5 WBC/hpf   Bacteria, UA RARE (A) NONE SEEN   Squamous Epithelial / LPF 0-5 0 - 5   Mucus PRESENT   Pregnancy, urine POC     Status: Abnormal   Collection Time: 12/20/18  5:42 PM  Result Value Ref Range   Preg Test, Ur POSITIVE (A) NEGATIVE    IMAGING No results found.  MAU COURSE Orders Placed This Encounter  Procedures  . Urinalysis, Routine w reflex microscopic  . Soap suds enema  . Pregnancy, urine POC  . Discharge patient   Meds ordered this encounter  Medications  .  polyethylene glycol powder (GLYCOLAX/MIRALAX) powder    Sig: Take 17 g by mouth 2 (two) times daily as needed for moderate constipation.    Dispense:  500 g    Refill:  2    Order Specific  Question:   Supervising Provider    Answer:   Duane Lope H [[id number]]  . meclizine (ANTIVERT) 25 MG tablet    Sig: Take 1 tablet (25 mg total) by mouth 3 (three) times daily as needed for dizziness.    Dispense:  30 tablet    Refill:  2    Order Specific Question:   Supervising Provider    Answer:   Lazaro Arms [[id number]]   Patient reports having large bowel movement after soapsuds enema.  Feels much better.  MDM - Constipation pregnancy likely side effect of Zofran.  Patient feels that nausea and vomiting pregnancy have improved enough that she does not need to take Zofran daily.  Also recommended MiraLAX 1-2 times per day as needed for constipation. -Nausea and vomiting of pregnancy: Vomiting is improved but patient states she still has lot of nausea and motion sickness.  Phenergan has been too sedating.  Recommend trying meclizine or Diclegis (already has prescription).  ASSESSMENT 1. Constipation during pregnancy in first trimester   2. Nausea/vomiting in pregnancy     PLAN Discharge home in stable condition. First trimester precautions Follow-up Information    Alm Bustard, MD Follow up.   Specialty:  Specialist Why:  As scheduled or sooner as needed if symptoms worsen Contact information: [address] [place] [address] [phone]        [facility] Follow up.   Specialty:  Obstetrics and Gynecology Why:  As needed in pregnancy emergencies Contact information: [address] [id number] mc [place] [address] [phone]         Allergies as of 12/20/2018   No Known Allergies     Medication List    STOP taking these medications   esomeprazole 20 MG capsule Commonly known as:  NEXIUM   naproxen 375 MG  tablet Commonly known as:  NAPROSYN     TAKE these medications   meclizine 25 MG tablet Commonly known as:  ANTIVERT Take 1 tablet (25 mg total) by mouth 3 (three) times daily as needed for dizziness.   ondansetron 4 MG disintegrating tablet Commonly known as:  ZOFRAN-ODT Take 4 mg by mouth every 8 (eight) hours as needed for nausea or vomiting.   polyethylene glycol powder powder Commonly known as:  GLYCOLAX/MIRALAX Take 17 g by mouth 2 (two) times daily as needed for moderate constipation.   prenatal multivitamin Tabs tablet Take 1 tablet by mouth daily at 12 noon.        Katrinka Blazing, [place], CNM 12/20/2018  8:14 PM

## 2019-03-09 ENCOUNTER — Inpatient Hospital Stay (HOSPITAL_COMMUNITY)
Admission: AD | Admit: 2019-03-09 | Discharge: 2019-03-09 | Disposition: A | Discharge status: Home / Self Care | Payer: Medicaid Other | Attending: Obstetrics and Gynecology | Admitting: Obstetrics and Gynecology

## 2019-03-09 ENCOUNTER — Other Ambulatory Visit: Payer: Self-pay

## 2019-03-09 ENCOUNTER — Encounter (HOSPITAL_COMMUNITY): Payer: Self-pay | Admitting: *Deleted

## 2019-03-09 DIAGNOSIS — G44209 Tension-type headache, unspecified, not intractable: Secondary | ICD-10-CM | POA: Diagnosis not present

## 2019-03-09 DIAGNOSIS — O99352 Diseases of the nervous system complicating pregnancy, second trimester: Secondary | ICD-10-CM | POA: Insufficient documentation

## 2019-03-09 DIAGNOSIS — Z3A22 22 weeks gestation of pregnancy: Secondary | ICD-10-CM | POA: Diagnosis not present

## 2019-03-09 DIAGNOSIS — O26892 Other specified pregnancy related conditions, second trimester: Secondary | ICD-10-CM | POA: Diagnosis not present

## 2019-03-09 DIAGNOSIS — M542 Cervicalgia: Secondary | ICD-10-CM | POA: Diagnosis not present

## 2019-03-09 DIAGNOSIS — O26893 Other specified pregnancy related conditions, third trimester: Secondary | ICD-10-CM | POA: Insufficient documentation

## 2019-03-09 DIAGNOSIS — R51 Headache: Secondary | ICD-10-CM | POA: Diagnosis present

## 2019-03-09 LAB — URINALYSIS, ROUTINE W REFLEX MICROSCOPIC
Bilirubin Urine: NEGATIVE
Glucose, UA: NEGATIVE mg/dL
Ketones, ur: NEGATIVE mg/dL
Nitrite: NEGATIVE
PROTEIN: NEGATIVE mg/dL
Specific Gravity, Urine: 1.015 (ref 1.005–1.030)
pH: 8 (ref 5.0–8.0)

## 2019-03-09 MED ORDER — CYCLOBENZAPRINE HCL 10 MG PO TABS: 10.0000 mg | ORAL_TABLET | Freq: Three times a day (TID) | ORAL | 0 refills | Status: AC | PRN

## 2019-03-09 MED ORDER — CYCLOBENZAPRINE HCL 10 MG PO TABS
10.0000 mg | ORAL_TABLET | Freq: Three times a day (TID) | ORAL | Status: DC | PRN
  Administered 2019-03-09: 10 mg via ORAL
  Filled 2019-03-09: qty 1

## 2019-03-09 MED ORDER — BUTALBITAL-APAP-CAFFEINE 50-325-40 MG PO TABS
1.0000 | ORAL_TABLET | Freq: Four times a day (QID) | ORAL | Status: DC | PRN
  Administered 2019-03-09: 1 via ORAL
  Filled 2019-03-09: qty 1
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
## 2019-03-09 NOTE — Discharge Instructions (Signed)
Musculoskeletal Pain Musculoskeletal pain refers to aches and pains in your bones, joints, muscles, and the tissues that surround them. This pain can occur in any part of the body. It can last for a short time (acute) or a long time (chronic). A physical exam, lab tests, and imaging studies may be done to find the cause of your musculoskeletal pain. Follow these instructions at home:  Lifestyle  Try to control or lower your stress levels. Stress increases muscle tension and can worsen musculoskeletal pain. It is important to recognize when you are anxious or stressed and learn ways to manage it. This may include: ? Meditation or yoga. ? Cognitive or behavioral therapy. ? Acupuncture or massage therapy.  You may continue all activities unless the activities cause more pain. When the pain gets better, slowly resume your normal activities. Gradually increase the intensity and duration of your activities or exercise. Managing pain, stiffness, and swelling  Take over-the-counter and prescription medicines only as told by your health care provider.  When your pain is severe, bed rest may be helpful. Lie or sit in any position that is comfortable, but get out of bed and walk around at least every couple of hours.  If directed, apply heat to the affected area as often as told by your health care provider. Use the heat source that your health care provider recommends, such as a moist heat pack or a heating pad. ? Place a towel between your skin and the heat source. ? Leave the heat on for 20-30 minutes. ? Remove the heat if your skin turns bright red. This is especially important if you are unable to feel pain, heat, or cold. You may have a greater risk of getting burned.  If directed, put ice on the painful area. ? Put ice in a plastic bag. ? Place a towel between your skin and the bag. ? Leave the ice on for 20 minutes, 2-3 times a day. General instructions  Your health care provider may  recommend that you see a physical therapist. This person can help you come up with a safe exercise program. Do any exercises as told by your physical therapist.  Keep all follow-up visits, including any physical therapy visits, as told by your health care providers. This is important. Contact a health care provider if:  Your pain gets worse.  Medicines do not help ease your pain.  You cannot use the part of your body that hurts, such as your arm, leg, or neck.  You have trouble sleeping.  You have trouble doing your normal activities. Get help right away if:  You have a new injury and your pain is worse or different.  You feel numb or you have tingling in the painful area. Summary  Musculoskeletal pain refers to aches and pains in your bones, joints, muscles, and the tissues that surround them.  This pain can occur in any part of the body.  Your health care provider may recommend that you see a physical therapist. This person can help you come up with a safe exercise program. Do any exercises as told by your physical therapist.  Lower your stress level. Stress can worsen musculoskeletal pain. Ways to lower stress may include meditation, yoga, cognitive or behavioral therapy, acupuncture, and massage therapy. This information is not intended to replace advice given to you by your health care provider. Make sure you discuss any questions you have with your health care provider. Document Released: 12/13/2005 Document Revised: 01/12/2017 Document Reviewed:  01/12/2017 Elsevier Interactive Patient Education  2019 [employer]   General Headache Without Cause A headache is pain or discomfort that is felt around the head or neck area. There are many causes and types of headaches. In some cases, the cause may not be found. Follow these instructions at home: Watch your condition for any changes. Let your doctor know about them. Take these steps to help with your condition: Managing  pain      Take over-the-counter and prescription medicines only as told by your doctor.  Lie down in a dark, quiet room when you have a headache.  If told, put ice on your head and neck area: ? Put ice in a plastic bag. ? Place a towel between your skin and the bag. ? Leave the ice on for 20 minutes, 2-3 times per day.  If told, put heat on the affected area. Use the heat source that your doctor recommends, such as a moist heat pack or a heating pad. ? Place a towel between your skin and the heat source. ? Leave the heat on for 20-30 minutes. ? Remove the heat if your skin turns bright red. This is very important if you are unable to feel pain, heat, or cold. You may have a greater risk of getting burned.  Keep lights dim if bright lights bother you or make your headaches worse. Eating and drinking  Eat meals on a regular schedule.  If you drink alcohol: ? Limit how much you use to:  0-1 drink a day for women.  0-2 drinks a day for men. ? Be aware of how much alcohol is in your drink. In the [place] one drink equals one 12 oz bottle of beer (355 mL), one 5 oz glass of wine (148 mL), or one 1 oz glass of hard liquor (44 mL).  Stop drinking caffeine, or reduce how much caffeine you drink. General instructions   Keep a journal to find out if certain things bring on headaches. For example, write down: ? What you eat and drink. ? How much sleep you get. ? Any change to your diet or medicines.  Get a massage or try other ways to relax.  Limit stress.  Sit up straight. Do not tighten (tense) your muscles.  Do not use any products that contain nicotine or tobacco. This includes cigarettes, e-cigarettes, and chewing tobacco. If you need help quitting, ask your doctor.  Exercise regularly as told by your doctor.  Get enough sleep. This often means 7-9 hours of sleep each night.  Keep all follow-up visits as told by your doctor. This is important. Contact a doctor  if:  Your symptoms are not helped by medicine.  You have a headache that feels different than the other headaches.  You feel sick to your stomach (nauseous) or you throw up (vomit).  You have a fever. Get help right away if:  Your headache gets very bad quickly.  Your headache gets worse after a lot of physical activity.  You keep throwing up.  You have a stiff neck.  You have trouble seeing.  You have trouble speaking.  You have pain in the eye or ear.  Your muscles are weak or you lose muscle control.  You lose your balance or have trouble walking.  You feel like you will pass out (faint) or you pass out.  You are mixed up (confused).  You have a seizure. Summary  A headache is pain or discomfort that is felt  around the head or neck area.  There are many causes and types of headaches. In some cases, the cause may not be found.  Keep a journal to help find out what causes your headaches. Watch your condition for any changes. Let your doctor know about them.  Contact a doctor if you have a headache that is different from usual, or if your headache is not helped by medicine.  Get help right away if your headache gets very bad, you throw up, you have trouble seeing, you lose your balance, or you have a seizure. This information is not intended to replace advice given to you by your health care provider. Make sure you discuss any questions you have with your health care provider. Document Released: 09/21/2008 Document Revised: 07/03/2018 Document Reviewed: 07/03/2018 Elsevier Interactive Patient Education  2019 [employer].
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
## 2019-03-29 ENCOUNTER — Other Ambulatory Visit: Payer: Self-pay

## 2019-03-29 ENCOUNTER — Encounter (HOSPITAL_COMMUNITY): Payer: Self-pay

## 2019-03-29 ENCOUNTER — Inpatient Hospital Stay (HOSPITAL_COMMUNITY)
Admission: AD | Admit: 2019-03-29 | Discharge: 2019-03-30 | Disposition: A | Discharge status: Home / Self Care | Payer: Medicaid Other | Attending: Obstetrics and Gynecology | Admitting: Obstetrics and Gynecology

## 2019-03-29 DIAGNOSIS — R519 Headache, unspecified: Secondary | ICD-10-CM

## 2019-03-29 DIAGNOSIS — Z794 Long term (current) use of insulin: Secondary | ICD-10-CM | POA: Insufficient documentation

## 2019-03-29 DIAGNOSIS — F329 Major depressive disorder, single episode, unspecified: Secondary | ICD-10-CM | POA: Insufficient documentation

## 2019-03-29 DIAGNOSIS — O26892 Other specified pregnancy related conditions, second trimester: Secondary | ICD-10-CM | POA: Diagnosis present

## 2019-03-29 DIAGNOSIS — O99612 Diseases of the digestive system complicating pregnancy, second trimester: Secondary | ICD-10-CM | POA: Diagnosis not present

## 2019-03-29 DIAGNOSIS — G43909 Migraine, unspecified, not intractable, without status migrainosus: Secondary | ICD-10-CM | POA: Diagnosis not present

## 2019-03-29 DIAGNOSIS — K219 Gastro-esophageal reflux disease without esophagitis: Secondary | ICD-10-CM | POA: Diagnosis not present

## 2019-03-29 DIAGNOSIS — O24414 Gestational diabetes mellitus in pregnancy, insulin controlled: Secondary | ICD-10-CM | POA: Diagnosis not present

## 2019-03-29 DIAGNOSIS — O99342 Other mental disorders complicating pregnancy, second trimester: Secondary | ICD-10-CM | POA: Insufficient documentation

## 2019-03-29 DIAGNOSIS — R51 Headache: Secondary | ICD-10-CM

## 2019-03-29 DIAGNOSIS — Z3A25 25 weeks gestation of pregnancy: Secondary | ICD-10-CM | POA: Diagnosis not present

## 2019-03-29 DIAGNOSIS — Z3689 Encounter for other specified antenatal screening: Secondary | ICD-10-CM | POA: Insufficient documentation

## 2019-03-29 DIAGNOSIS — O99352 Diseases of the nervous system complicating pregnancy, second trimester: Secondary | ICD-10-CM | POA: Diagnosis not present

## 2019-03-29 DIAGNOSIS — Z79899 Other long term (current) drug therapy: Secondary | ICD-10-CM | POA: Insufficient documentation

## 2019-03-29 LAB — COMPREHENSIVE METABOLIC PANEL
ALT: 10 U/L (ref 0–44)
AST: 14 U/L — ABNORMAL LOW (ref 15–41)
Albumin: 2.8 g/dL — ABNORMAL LOW (ref 3.5–5.0)
Alkaline Phosphatase: 59 U/L (ref 38–126)
Anion gap: 8 (ref 5–15)
BUN: 6 mg/dL (ref 6–20)
CO2: 22 mmol/L (ref 22–32)
Calcium: 8.6 mg/dL — ABNORMAL LOW (ref 8.9–10.3)
Chloride: 103 mmol/L (ref 98–111)
Creatinine, Ser: 0.69 mg/dL (ref 0.44–1.00)
GFR calc Af Amer: >60 mL/min (ref >60)
GFR calc non Af Amer: >60 mL/min (ref >60)
Glucose, Bld: 100 mg/dL — ABNORMAL HIGH (ref 70–99)
Potassium: 3.4 mmol/L — ABNORMAL LOW (ref 3.5–5.1)
Sodium: 133 mmol/L — ABNORMAL LOW (ref 135–145)
Total Bilirubin: 0.3 mg/dL (ref 0.3–1.2)
Total Protein: 6.1 g/dL — ABNORMAL LOW (ref 6.5–8.1)

## 2019-03-29 LAB — URINALYSIS, ROUTINE W REFLEX MICROSCOPIC
Bilirubin Urine: NEGATIVE
Glucose, UA: NEGATIVE mg/dL
Hgb urine dipstick: NEGATIVE
Ketones, ur: NEGATIVE mg/dL
Leukocytes,Ua: NEGATIVE
Nitrite: NEGATIVE
Protein, ur: NEGATIVE mg/dL
Specific Gravity, Urine: 1.021 (ref 1.005–1.030)
pH: 6 (ref 5.0–8.0)

## 2019-03-29 LAB — CBC
HCT: 31.5 % — ABNORMAL LOW (ref 36.0–46.0)
Hemoglobin: 10.3 g/dL — ABNORMAL LOW (ref 12.0–15.0)
MCH: 29.9 pg (ref 26.0–34.0)
MCHC: 32.7 g/dL (ref 30.0–36.0)
MCV: 91.6 fL (ref 80.0–100.0)
Platelets: 210 10*3/uL (ref 150–400)
RBC: 3.44 MIL/uL — ABNORMAL LOW (ref 3.87–5.11)
RDW: 13.7 % (ref 11.5–15.5)
WBC: 7.4 10*3/uL (ref 4.0–10.5)
nRBC: 0 % (ref 0.0–0.2)

## 2019-03-29 LAB — PROTEIN / CREATININE RATIO, URINE
Creatinine, Urine: 154.56 mg/dL
Protein Creatinine Ratio: 0.06 mg/mg{Cre} (ref 0.00–0.15)
Total Protein, Urine: 10 mg/dL

## 2019-03-29 MED ORDER — DIPHENHYDRAMINE HCL 50 MG/ML IJ SOLN
12.5000 mg | Freq: Once | INTRAMUSCULAR | Status: AC
  Administered 2019-03-29: 12.5 mg via INTRAVENOUS
  Filled 2019-03-29: qty 1

## 2019-03-29 MED ORDER — LACTATED RINGERS IV BOLUS
1000.0000 mL | Freq: Once | INTRAVENOUS | Status: AC
  Administered 2019-03-29: 1000 mL via INTRAVENOUS

## 2019-03-29 MED ORDER — DIPHENHYDRAMINE HCL 25 MG PO TABS: 25.0000 mg | ORAL_TABLET | Freq: Every evening | ORAL | 0 refills | Status: AC | PRN

## 2019-03-29 MED ORDER — METOCLOPRAMIDE HCL 10 MG PO TABS: 10.0000 mg | ORAL_TABLET | Freq: Three times a day (TID) | ORAL | 0 refills | Status: AC

## 2019-03-29 MED ORDER — METOCLOPRAMIDE HCL 5 MG/ML IJ SOLN
10.0000 mg | Freq: Once | INTRAMUSCULAR | Status: AC
  Administered 2019-03-29: 21:00:00 10 mg via INTRAVENOUS
  Filled 2019-03-29: qty 2

## 2019-03-29 NOTE — MAU Provider Note (Addendum)
History     CSN: [id number]  Arrival date and time: 03/29/19 1911   First Provider Initiated Contact with Patient 03/29/19 2001      Chief Complaint  Patient presents with  . Headache   Ms. Erin Bullock is a 33 y.o. [id number] at [redacted]w[redacted]d who presents to MAU for preeclampsia evaluation after she started seeing spots. Pt reports she started seeing spots around 1800 today. Pt reports she also has a HA every day this week. Pt reports she tried to lay down earlier today, but that just made the HA worse. Pt took 2 pills of regular strength Tylenol around 1730. Pt reports the Tylenol did not help her HA at all.  Pt reports a hx of tension-migraine HA prior to pregnancy that necessitated ER treatment. Pt reports she also gets spots with her HA when she is not pregnant. Pt reports this feels like the HAs she used to get pre-pregnancy.  Pt denies N/V, epigastric pain, swelling in face and hands, sudden weight gain. Pt denies chest pain and SOB.  Pt denies constipation, diarrhea, or urinary problems. Pt denies fever, chills, fatigue, sweating or changes in appetite. Pt denies dizziness, light-headedness, weakness.  Pt denies VB, ctx, LOF and reports good FM.  Current pregnancy problems? insulin-dependent GDM (per pt report and prenatal records), depression/anxiety, hx of pre-eclampsia after delivery Blood Type? A Positive Allergies? NKDA Current medications? Zoloft 25mg , insulin, PNVs Current [facility] & next appt? Dr. Shawnie Pons, [place], 04/19/2019   OB History    Gravida  5   Para  2   Term  2   Preterm  0   AB  2   Living  2     SAB  1   TAB  1   Ectopic  0   Multiple  0   Live Births  2           Past Medical History:  Diagnosis Date  . Acid reflux   . Chlamydia   . Cholecystitis, acute 09/22/2014  . Depression   . Gonorrhea   . Pregnancy induced hypertension    after first delivery, post partem  . Urinary tract infection   . UTI (urinary tract infection) 09/22/2014     Past Surgical History:  Procedure Laterality Date  . CHOLECYSTECTOMY N/A 09/18/2014   Procedure: LAPAROSCOPIC CHOLECYSTECTOMY WITH INTRAOPERATIVE CHOLANGIOGRAM;  Surgeon: [name], MD;  Location: [facility];  Service: General;  Laterality: N/A;  . INDUCED ABORTION      Family History  Problem Relation Age of Onset  . Diabetes Mother   . Stroke Mother   . Hypertension Mother   . Glaucoma Mother   . Hypertension Father   . Cancer Maternal Aunt   . Heart disease Maternal Grandmother     Social History   Tobacco Use  . Smoking status: Never Smoker  . Smokeless tobacco: Never Used  Substance Use Topics  . Alcohol use: Not Currently    Comment: occ  . Drug use: Not Currently    Types: Marijuana    Comment: last smoked 09/2018    Allergies: No Known Allergies  Medications Prior to Admission  Medication Sig Dispense Refill Last Dose  . acetaminophen (TYLENOL) 325 MG tablet Take 650 mg by mouth every 6 (six) hours as needed.   03/29/2019 at 1730  . cyclobenzaprine (FLEXERIL) 10 MG tablet Take 1 tablet (10 mg total) by mouth 3 (three) times daily as needed for muscle spasms. 30 tablet 0   .  meclizine (ANTIVERT) 25 MG tablet Take 1 tablet (25 mg total) by mouth 3 (three) times daily as needed for dizziness. 30 tablet 2   . ondansetron (ZOFRAN-ODT) 4 MG disintegrating tablet Take 4 mg by mouth every 8 (eight) hours as needed for nausea or vomiting.   Past Week at Unknown time  . polyethylene glycol powder (GLYCOLAX/MIRALAX) powder Take 17 g by mouth 2 (two) times daily as needed for moderate constipation. 500 g 2   . Prenatal Vit-Fe Fumarate-FA (PRENATAL MULTIVITAMIN) TABS tablet Take 1 tablet by mouth daily at 12 noon.       Review of Systems  Constitutional: Negative for chills, diaphoresis, fatigue and fever.  Respiratory: Negative for shortness of breath.   Cardiovascular: Negative for chest pain.  Gastrointestinal: Negative for abdominal pain, constipation, diarrhea, nausea  and vomiting.  Genitourinary: Negative for dysuria, flank pain, frequency, pelvic pain, urgency, vaginal bleeding and vaginal discharge.  Neurological: Positive for headaches (plus seeing spots). Negative for dizziness, weakness and light-headedness.   Physical Exam   Blood pressure 118/72, pulse 97, temperature 98.5 F (36.9 C), resp. rate 17, height 5' (1.524 m), weight 89.4 kg, last menstrual period 10/04/2018, SpO2 99 %.  Patient Vitals for the past 24 hrs:  BP Temp Pulse Resp SpO2 Height Weight  03/29/19 1956 118/72 - 97 - - - -  03/29/19 1930 127/72 98.5 F (36.9 C) 90 17 99 % 5' (1.524 m) 89.4 kg    Physical Exam  Constitutional: She is oriented to person, place, and time. She appears well-developed and well-nourished. She appears distressed.  HENT:  Head: Normocephalic and atraumatic.  Respiratory: Effort normal.  GI: Soft. She exhibits no distension and no mass. There is no abdominal tenderness. There is no rebound and no guarding.  Neurological: She is alert and oriented to person, place, and time.  Skin: Skin is warm and dry. She is not diaphoretic.  Psychiatric: She has a normal mood and affect.   Results for orders placed or performed during the hospital encounter of 03/29/19 (from the past 24 hour(s))  Urinalysis, Routine w reflex microscopic     Status: Abnormal   Collection Time: 03/29/19  7:36 PM  Result Value Ref Range   Color, Urine YELLOW YELLOW   APPearance HAZY (A) CLEAR   Specific Gravity, Urine 1.021 1.005 - 1.030   pH 6.0 5.0 - 8.0   Glucose, UA NEGATIVE NEGATIVE mg/dL   Hgb urine dipstick NEGATIVE NEGATIVE   Bilirubin Urine NEGATIVE NEGATIVE   Ketones, ur NEGATIVE NEGATIVE mg/dL   Protein, ur NEGATIVE NEGATIVE mg/dL   Nitrite NEGATIVE NEGATIVE   Leukocytes,Ua NEGATIVE NEGATIVE   No results found.  MAU Course  Procedures  MDM -pt here for migraine-like HA with hx of pre-eclampsia after previous delivery -pre-eclampsia labs done -HA cocktail  given with LR/Benadryl/Reglan (decadron avoided d/t GDM) -UA: WNL except hazy -CBC: H/H 10.3/31.5, otherwise WNL -CMP: no abnormalities requiring treatment at this time. -PRO/Creat: 0.06 -HA significantly improved after cocktail -EFM baseline 150, mod variability, pos accels (10x10), few variable decels. TOCO no ctx. -pt discharged to home in stable condition  Orders Placed This Encounter  Procedures  . Urinalysis, Routine w reflex microscopic    Standing Status:   Standing    Number of Occurrences:   1  . Comprehensive metabolic panel    Standing Status:   Standing    Number of Occurrences:   1  . CBC    Standing Status:   Standing  Number of Occurrences:   1  . Protein / creatinine ratio, urine    Standing Status:   Standing    Number of Occurrences:   1  . Insert peripheral IV    Standing Status:   Standing    Number of Occurrences:   1  . Discharge patient    Order Specific Question:   Discharge disposition    Answer:   01-Home or Self Care [1]    Order Specific Question:   Discharge patient date    Answer:   03/29/2019   Meds ordered this encounter  Medications  . lactated ringers bolus 1,000 mL  . diphenhydrAMINE (BENADRYL) injection 12.5 mg  . metoCLOPramide (REGLAN) injection 10 mg  . metoCLOPramide (REGLAN) 10 MG tablet    Sig: Take 1 tablet (10 mg total) by mouth 3 (three) times daily with meals for 30 days.    Dispense:  90 tablet    Refill:  0    Order Specific Question:   Supervising Provider    Answer:   CONSTANT, PEGGY [[id number]]  . diphenhydrAMINE (BENADRYL) 25 MG tablet    Sig: Take 1 tablet (25 mg total) by mouth at bedtime as needed for up to 30 days.    Dispense:  30 tablet    Refill:  0    Order Specific Question:   Supervising Provider    Answer:   CONSTANT, PEGGY [[id number]]    Assessment and Plan   1. Headache in pregnancy, antepartum, second trimester   2. [redacted] weeks gestation of pregnancy   3. NST (non-stress test) reactive     Allergies as of  03/29/2019   No Known Allergies     Medication List    TAKE these medications   acetaminophen 325 MG tablet Commonly known as:  TYLENOL Take 650 mg by mouth every 6 (six) hours as needed.   cyclobenzaprine 10 MG tablet Commonly known as:  FLEXERIL Take 1 tablet (10 mg total) by mouth 3 (three) times daily as needed for muscle spasms.   diphenhydrAMINE 25 MG tablet Commonly known as:  BENADRYL Take 1 tablet (25 mg total) by mouth at bedtime as needed for up to 30 days.   meclizine 25 MG tablet Commonly known as:  ANTIVERT Take 1 tablet (25 mg total) by mouth 3 (three) times daily as needed for dizziness.   metoCLOPramide 10 MG tablet Commonly known as:  REGLAN Take 1 tablet (10 mg total) by mouth 3 (three) times daily with meals for 30 days.   ondansetron 4 MG disintegrating tablet Commonly known as:  ZOFRAN-ODT Take 4 mg by mouth every 8 (eight) hours as needed for nausea or vomiting.   polyethylene glycol powder powder Commonly known as:  GLYCOLAX/MIRALAX Take 17 g by mouth 2 (two) times daily as needed for moderate constipation.   prenatal multivitamin Tabs tablet Take 1 tablet by mouth daily at 12 noon.      -discussed preeclampsia/HA/return MAU precautions -pt discharged to home in stable condition -f/u with OB provider at regularly schedule visit.  Odie Sera Amariz Flamenco 03/29/2019, 8:03 PM

## 2019-03-29 NOTE — MAU Note (Signed)
States she has had a headache for a couple days and today started seeing spots.  Took tylenol for her headache last around 1730.  Had pre-e after delivering her first baby.  No epigastric pain or increased swelling.

## 2019-03-29 NOTE — Discharge Instructions (Signed)
.  Preeclampsia and Eclampsia    Preeclampsia is a serious condition that may develop during pregnancy. It is also called toxemia of pregnancy. This condition causes high blood pressure along with other symptoms, such as swelling and headaches. These symptoms may develop as the condition gets worse. Preeclampsia may occur at 20 weeks of pregnancy or later.  Diagnosing and treating preeclampsia early is very important. If not treated early, it can cause serious problems for you and your baby. One problem it can lead to is eclampsia. Eclampsia is a condition that causes muscle jerking or shaking (convulsions or seizures) and other serious problems for the mother. During pregnancy, delivering your baby may be the best treatment for preeclampsia or eclampsia. For most women, preeclampsia and eclampsia symptoms go away after giving birth.  In rare cases, a woman may develop preeclampsia after giving birth (postpartum preeclampsia). This usually occurs within 48 hours after childbirth but may occur up to 6 weeks after giving birth.  What are the causes?  The cause of preeclampsia is not known.  What increases the risk?  The following risk factors make you more likely to develop preeclampsia:   Being pregnant for the first time.   Having had preeclampsia during a past pregnancy.   Having a family history of preeclampsia.   Having high blood pressure.   Being pregnant with more than one baby.   Being 35 or older.   Being African-American.   Having kidney disease or diabetes.   Having medical conditions such as lupus or blood diseases.   Being very overweight (obese).  What are the signs or symptoms?  The earliest signs of preeclampsia are:   High blood pressure.   Increased protein in your urine. Your health care provider will check for this at every visit before you give birth (prenatal visit).  Other symptoms that may develop as the condition gets worse include:   Severe headaches.   Sudden weight  gain.   Swelling of the hands, face, legs, and feet.   Nausea and vomiting.   Vision problems, such as blurred or double vision.   Numbness in the face, arms, legs, and feet.   Urinating less than usual.   Dizziness.   Slurred speech.   Abdominal pain, especially upper abdominal pain.   Convulsions or seizures.  How is this diagnosed?  There are no screening tests for preeclampsia. Your health care provider will ask you about symptoms and check for signs of preeclampsia during your prenatal visits. You may also have tests that include:   Urine tests.   Blood tests.   Checking your blood pressure.   Monitoring your baby's heart rate.   Ultrasound.  How is this treated?  You and your health care provider will determine the treatment approach that is best for you. Treatment may include:   Having more frequent prenatal exams to check for signs of preeclampsia, if you have an increased risk for preeclampsia.   Medicine to lower your blood pressure.   Staying in the hospital, if your condition is severe. There, treatment will focus on controlling your blood pressure and the amount of fluids in your body (fluid retention).   Taking medicine (magnesium sulfate) to prevent seizures. This may be given as an injection or through an IV.   Taking a low-dose aspirin during your pregnancy.   Delivering your baby early, if your condition gets worse. You may have your labor started with medicine (induced), or you may have a cesarean   delivery.  Follow these instructions at home:  Eating and drinking     Drink enough fluid to keep your urine pale yellow.   Avoid caffeine.  Lifestyle   Do not use any products that contain nicotine or tobacco, such as cigarettes and e-cigarettes. If you need help quitting, ask your health care provider.   Do not use alcohol or drugs.   Avoid stress as much as possible. Rest and get plenty of sleep.  General instructions   Take over-the-counter and prescription medicines only as  told by your health care provider.   When lying down, lie on your left side. This keeps pressure off your major blood vessels.   When sitting or lying down, raise (elevate) your feet. Try putting some pillows underneath your lower legs.   Exercise regularly. Ask your health care provider what kinds of exercise are best for you.   Keep all follow-up and prenatal visits as told by your health care provider. This is important.  How is this prevented?  There is no known way of preventing preeclampsia or eclampsia from developing. However, to lower your risk of complications and detect problems early:   Get regular prenatal care. Your health care provider may be able to diagnose and treat the condition early.   Maintain a healthy weight. Ask your health care provider for help managing weight gain during pregnancy.   Work with your health care provider to manage any long-term (chronic) health conditions you have, such as diabetes or kidney problems.   You may have tests of your blood pressure and kidney function after giving birth.   Your health care provider may have you take low-dose aspirin during your next pregnancy.  Contact a health care provider if:   You have symptoms that your health care provider told you may require more treatment or monitoring, such as:  ? Headaches.  ? Nausea or vomiting.  ? Abdominal pain.  ? Dizziness.  ? Light-headedness.  Get help right away if:   You have severe:  ? Abdominal pain.  ? Headaches that do not get better.  ? Dizziness.  ? Vision problems.  ? Confusion.  ? Nausea or vomiting.   You have any of the following:  ? A seizure.  ? Sudden, rapid weight gain.  ? Sudden swelling in your hands, ankles, or face.  ? Trouble moving any part of your body.  ? Numbness in any part of your body.  ? Trouble speaking.  ? Abnormal bleeding.   You faint.  Summary   Preeclampsia is a serious condition that may develop during pregnancy. It is also called toxemia of pregnancy.   This  condition causes high blood pressure along with other symptoms, such as swelling and headaches.   Diagnosing and treating preeclampsia early is very important. If not treated early, it can cause serious problems for you and your baby.   Get help right away if you have symptoms that your health care provider told you to watch for.  This information is not intended to replace advice given to you by your health care provider. Make sure you discuss any questions you have with your health care provider.  Document Released: 12/10/2000 Document Revised: 11/29/2017 Document Reviewed: 07/19/2016  [employer]vier Interactive Patient Education  2019 [employer]

## 2019-04-08 ENCOUNTER — Inpatient Hospital Stay (HOSPITAL_COMMUNITY)
Admission: AD | Admit: 2019-04-08 | Discharge: 2019-04-08 | Disposition: A | Discharge status: Home / Self Care | Payer: Medicaid Other | Attending: Family Medicine | Admitting: Family Medicine

## 2019-04-08 ENCOUNTER — Encounter (HOSPITAL_COMMUNITY): Payer: Self-pay | Admitting: *Deleted

## 2019-04-08 ENCOUNTER — Other Ambulatory Visit: Payer: Self-pay

## 2019-04-08 DIAGNOSIS — O4702 False labor before 37 completed weeks of gestation, second trimester: Secondary | ICD-10-CM | POA: Insufficient documentation

## 2019-04-08 DIAGNOSIS — B373 Candidiasis of vulva and vagina: Secondary | ICD-10-CM | POA: Diagnosis not present

## 2019-04-08 DIAGNOSIS — O98812 Other maternal infectious and parasitic diseases complicating pregnancy, second trimester: Secondary | ICD-10-CM | POA: Diagnosis not present

## 2019-04-08 DIAGNOSIS — O98913 Unspecified maternal infectious and parasitic disease complicating pregnancy, third trimester: Secondary | ICD-10-CM | POA: Diagnosis not present

## 2019-04-08 DIAGNOSIS — B3731 Acute candidiasis of vulva and vagina: Secondary | ICD-10-CM

## 2019-04-08 DIAGNOSIS — Z3A26 26 weeks gestation of pregnancy: Secondary | ICD-10-CM

## 2019-04-08 HISTORY — DX: Gestational diabetes mellitus in pregnancy, unspecified control: O24.419

## 2019-04-08 LAB — FETAL FIBRONECTIN: Fetal Fibronectin: NEGATIVE

## 2019-04-08 LAB — URINALYSIS, ROUTINE W REFLEX MICROSCOPIC
Bilirubin Urine: NEGATIVE
Glucose, UA: NEGATIVE mg/dL
Ketones, ur: NEGATIVE mg/dL
Leukocytes,Ua: NEGATIVE
Nitrite: NEGATIVE
Protein, ur: NEGATIVE mg/dL
Specific Gravity, Urine: 1.017 (ref 1.005–1.030)
pH: 6 (ref 5.0–8.0)

## 2019-04-08 LAB — WET PREP, GENITAL
Clue Cells Wet Prep HPF POC: NONE SEEN
Sperm: NONE SEEN
Trich, Wet Prep: NONE SEEN

## 2019-04-08 MED ORDER — TERCONAZOLE 0.4 % VA CREA: 1.0000 | TOPICAL_CREAM | Freq: Every day | VAGINAL | 0 refills | Status: AC

## 2019-04-08 NOTE — MAU Note (Addendum)
Ctxs since 0200 coming every 3-39mins. Denies LOF or bleeding. Erin Bullock goes to Dr Shawnie Pons in [facility] but plans to deliver here. Erin Bullock aware Dr Shawnie Pons does not deliver at [facility]

## 2019-04-08 NOTE — Progress Notes (Signed)
Erin Bullock notified of pt's admission and assessment. Aware of ctx pattern, FHR and monitor strip. U/A in process. Will see pt

## 2019-04-08 NOTE — MAU Provider Note (Signed)
CC:  Chief Complaint  Patient presents with  . Contractions     First Provider Initiated Contact with Patient 04/08/19 0549      HPI: Erin Bullock is a 33 y.o. year old G15P2022 female at [redacted]w[redacted]d weeks gestation who presents to MAU reporting contractions every 5 minutes since 2 AM.  Decreased since arriving to maternity admissions.   Associated Sx: Negative for fever, chills, urinary complaints, GI complaints.  Positive for increased vaginal discharge. Vaginal bleeding: Denies Leaking of fluid: Denies Fetal movement: Active  Gets prenatal care with Dr. Shawnie Pons in [facility].  O:  Patient Vitals for the past 24 hrs:  BP Temp Pulse Resp SpO2 Height Weight  04/08/19 0459 125/68 98 F (36.7 C) 94 18 100 % 5' (1.524 m) 87.5 kg    General: NAD Heart: Regular rate Lungs: Normal rate and effort Abd: Soft, NT, Gravid, S=D Pelvic: NEFG, negative leaking of fluid or blood.  Small amount of thin and curdlike, white, odorless discharge. Dilation: Closed Effacement (%): Thick Cervical Position: Posterior Station: Ballotable Presentation: Undeterminable Exam by:: Dorathy Kinsman, CNM  EFM: 140, Moderate variability, 15 x 15 accelerations, no decelerations Toco: Rare UCs with UI  Results for orders placed or performed during the hospital encounter of 04/08/19 (from the past 24 hour(s))  Urinalysis, Routine w reflex microscopic     Status: Abnormal   Collection Time: 04/08/19  5:22 AM  Result Value Ref Range   Color, Urine YELLOW YELLOW   APPearance HAZY (A) CLEAR   Specific Gravity, Urine 1.017 1.005 - 1.030   pH 6.0 5.0 - 8.0   Glucose, UA NEGATIVE NEGATIVE mg/dL   Hgb urine dipstick SMALL (A) NEGATIVE   Bilirubin Urine NEGATIVE NEGATIVE   Ketones, ur NEGATIVE NEGATIVE mg/dL   Protein, ur NEGATIVE NEGATIVE mg/dL   Nitrite NEGATIVE NEGATIVE   Leukocytes,Ua NEGATIVE NEGATIVE   RBC / HPF 0-5 0 - 5 RBC/hpf   WBC, UA 0-5 0 - 5 WBC/hpf   Bacteria, UA FEW (A) NONE SEEN   Squamous  Epithelial / LPF 0-5 0 - 5   Mucus PRESENT   Fetal fibronectin     Status: None   Collection Time: 04/08/19  6:00 AM  Result Value Ref Range   Fetal Fibronectin NEGATIVE NEGATIVE  Wet prep, genital     Status: Abnormal   Collection Time: 04/08/19  6:00 AM  Result Value Ref Range   Yeast Wet Prep HPF POC PRESENT (A) NONE SEEN   Trich, Wet Prep NONE SEEN NONE SEEN   Clue Cells Wet Prep HPF POC NONE SEEN NONE SEEN   WBC, Wet Prep HPF POC MODERATE (A) NONE SEEN   Sperm NONE SEEN      Orders Placed This Encounter  Procedures  . Wet prep, genital  . Urinalysis, Routine w reflex microscopic  . Fetal fibronectin  . Discharge patient   Meds ordered this encounter  Medications  . terconazole (TERAZOL 7) 0.4 % vaginal cream    Sig: Place 1 applicator vaginally at bedtime.    Dispense:  45 g    Refill:  0    X 7 days    Order Specific Question:   Supervising Provider    Answer:   Reva Bores [[id number]]   MDM - Contractions have decreased significantly since arriving in MAU.  Cervix long and closed.  Fetal fibronectin negative.  No evidence of active preterm labor.  -Vaginal discharge from vaginal yeast infection.  Rx Terazol 7.  A: 4525w4d week IUP 1. Preterm uterine contractions in second trimester, antepartum   2. Vaginal yeast infection   FHR reactive  P: Discharge home in stable condition. Preterm labor precautions and fetal kick counts. Increase fluids and rest Follow-up Information    Alm Bustardorn, Henry H, MD Follow up.   Specialty:  Obstetrics and Gynecology Why:  As scheduled or sooner as needed if symptoms worsen Contact information: [address] [place] [id number] [phone]        [facility] Follow up.   Specialty:  Obstetrics and Gynecology Why:  As needed in pregnancy emergencies Contact information: [address] [id number] mc [address] [id number] [phone]           Katrinka BlazingSmith, [place],  [place] 04/08/2019 6:54 AM  3

## 2019-04-08 NOTE — Discharge Instructions (Signed)

## 2019-06-24 ENCOUNTER — Other Ambulatory Visit: Payer: Self-pay

## 2019-06-24 ENCOUNTER — Encounter (HOSPITAL_COMMUNITY): Payer: Self-pay

## 2019-06-24 ENCOUNTER — Inpatient Hospital Stay (HOSPITAL_BASED_OUTPATIENT_CLINIC_OR_DEPARTMENT_OTHER): Payer: Medicaid Other

## 2019-06-24 ENCOUNTER — Inpatient Hospital Stay (HOSPITAL_COMMUNITY)
Admission: AD | Admit: 2019-06-24 | Discharge: 2019-06-24 | Disposition: A | Discharge status: Home / Self Care | Payer: Medicaid Other | Attending: Obstetrics and Gynecology | Admitting: Obstetrics and Gynecology

## 2019-06-24 DIAGNOSIS — O368130 Decreased fetal movements, third trimester, not applicable or unspecified: Secondary | ICD-10-CM | POA: Insufficient documentation

## 2019-06-24 DIAGNOSIS — M545 Low back pain: Secondary | ICD-10-CM | POA: Diagnosis not present

## 2019-06-24 DIAGNOSIS — Z3A37 37 weeks gestation of pregnancy: Secondary | ICD-10-CM | POA: Diagnosis not present

## 2019-06-24 DIAGNOSIS — O9989 Other specified diseases and conditions complicating pregnancy, childbirth and the puerperium: Secondary | ICD-10-CM

## 2019-06-24 DIAGNOSIS — M549 Dorsalgia, unspecified: Secondary | ICD-10-CM

## 2019-06-24 DIAGNOSIS — O99891 Other specified diseases and conditions complicating pregnancy: Secondary | ICD-10-CM

## 2019-06-24 DIAGNOSIS — Z3689 Encounter for other specified antenatal screening: Secondary | ICD-10-CM

## 2019-06-24 MED ORDER — LIDOCAINE VISCOUS HCL 2 % MT SOLN
15.0000 mL | Freq: Once | OROMUCOSAL | Status: AC
  Administered 2019-06-24: 15 mL via ORAL
  Filled 2019-06-24: qty 15

## 2019-06-24 MED ORDER — ALUM & MAG HYDROXIDE-SIMETH 200-200-20 MG/5ML PO SUSP
30.0000 mL | Freq: Once | ORAL | Status: AC
  Administered 2019-06-24: 30 mL via ORAL
  Filled 2019-06-24: qty 30

## 2019-06-24 MED ORDER — CYCLOBENZAPRINE HCL 10 MG PO TABS
10.0000 mg | ORAL_TABLET | Freq: Once | ORAL | Status: AC
  Administered 2019-06-24: 10 mg via ORAL
  Filled 2019-06-24: qty 1

## 2019-06-24 NOTE — MAU Note (Signed)
Patient presents to MAU reports DFM. Patient reports feeling 1 movement since noon today.  Patient denies bleeding or LOF.  Patient reports ctx, but hasn't been timing them.

## 2019-06-24 NOTE — MAU Provider Note (Signed)
History     CSN: [id number]  Arrival date and time: 06/24/19 2014   First Provider Initiated Contact with Patient 06/24/19 2051      Chief Complaint  Patient presents with  . Decreased Fetal Movement   [name] is a 33 y.o. G5P2 at [redacted]w[redacted]d who presents to MAU with complaints of DFM and back pain. Erin Bullock reports Erin Bullock has not felt baby move since noon today, prior to noon had only felt the baby move once. Erin Bullock denies having abdominal pain or contractions, vaginal bleeding or vaginal discharge. Reports occasional lower back pain and pelvic pressure. Rates pain 6/10- has not taken any medication for pain. Receives prenatal care at Dr Micah Noel in [facility]- last prenatal visit this past Thursday.    OB History    Gravida  5   Para  2   Term  2   Preterm  0   AB  2   Living  2     SAB  1   TAB  1   Ectopic  0   Multiple  0   Live Births  2           Past Medical History:  Diagnosis Date  . Acid reflux   . Chlamydia   . Cholecystitis, acute 09/22/2014  . Depression   . Gestational diabetes   . Gonorrhea   . Pregnancy induced hypertension    after first delivery, post partem  . Urinary tract infection   . UTI (urinary tract infection) 09/22/2014    Past Surgical History:  Procedure Laterality Date  . CHOLECYSTECTOMY N/A 09/18/2014   Procedure: LAPAROSCOPIC CHOLECYSTECTOMY WITH INTRAOPERATIVE CHOLANGIOGRAM;  Surgeon: Kaylyn Lim, MD;  Location: [facility]S;  Service: General;  Laterality: N/A;  . INDUCED ABORTION      Family History  Problem Relation Age of Onset  . Diabetes Mother   . Stroke Mother   . Hypertension Mother   . Glaucoma Mother   . Hypertension Father   . Cancer Maternal Aunt   . Heart disease Maternal Grandmother     Social History   Tobacco Use  . Smoking status: Never Smoker  . Smokeless tobacco: Never Used  Substance Use Topics  . Alcohol use: Not Currently    Comment: occ  . Drug use: Not Currently    Types: Marijuana    Comment: last  smoked 09/2018    Allergies: No Known Allergies  Medications Prior to Admission  Medication Sig Dispense Refill Last Dose  . acetaminophen (TYLENOL) 325 MG tablet Take 650 mg by mouth every 6 (six) hours as needed.     . cyclobenzaprine (FLEXERIL) 10 MG tablet Take 1 tablet (10 mg total) by mouth 3 (three) times daily as needed for muscle spasms. (Patient not taking: Reported on 04/08/2019) 30 tablet 0   . diphenhydrAMINE (BENADRYL) 25 MG tablet Take 1 tablet (25 mg total) by mouth at bedtime as needed for up to 30 days. 30 tablet 0   . insulin detemir (LEVEMIR) 100 UNIT/ML injection Inject 25 Units into the skin at bedtime.     . meclizine (ANTIVERT) 25 MG tablet Take 1 tablet (25 mg total) by mouth 3 (three) times daily as needed for dizziness. 30 tablet 2   . metoCLOPramide (REGLAN) 10 MG tablet Take 1 tablet (10 mg total) by mouth 3 (three) times daily with meals for 30 days. 90 tablet 0   . ondansetron (ZOFRAN-ODT) 4 MG disintegrating tablet Take 4 mg by mouth every 8 (  eight) hours as needed for nausea or vomiting.     . polyethylene glycol powder (GLYCOLAX/MIRALAX) powder Take 17 g by mouth 2 (two) times daily as needed for moderate constipation. 500 g 2   . Prenatal Vit-Fe Fumarate-FA (PRENATAL MULTIVITAMIN) TABS tablet Take 1 tablet by mouth daily at 12 noon.     Marland Kitchen. terconazole (TERAZOL 7) 0.4 % vaginal cream Place 1 applicator vaginally at bedtime. 45 g 0     Review of Systems  Constitutional: Negative.   Respiratory: Negative.   Cardiovascular: Negative.   Gastrointestinal: Negative.   Genitourinary: Positive for pelvic pain. Negative for difficulty urinating, dysuria, frequency, hematuria, vaginal bleeding and vaginal discharge.  Musculoskeletal: Positive for back pain.  Neurological: Negative.    Physical Exam   Blood pressure 125/76, pulse 96, temperature 98.2 F (36.8 C), temperature source Oral, resp. rate 16, height 4\' 11"  (1.499 m), weight 90.7 kg, last menstrual period  10/04/2018, SpO2 100 %.  Physical Exam  Nursing note and vitals reviewed. Constitutional: Erin Bullock is oriented to person, place, and time. Erin Bullock appears well-developed and well-nourished. No distress.  Cardiovascular: Normal rate, regular rhythm and normal heart sounds.  Respiratory: Effort normal and breath sounds normal. No respiratory distress. Erin Bullock has no wheezes.  GI:  Gravid large for gestational age, no fetal movement palpated by CNM during assessment   Musculoskeletal: Normal range of motion.        General: No edema.  Neurological: Erin Bullock is alert and oriented to person, place, and time.  Psychiatric: Erin Bullock has a normal mood and affect. Her behavior is normal. Thought content normal.   Cervical examination performed by RN  Dilation: Fingertip Exam by:: Suezanne JacquetJ Williams, RN   FHR: 135/moderate/+accels/ no decelerations  Toco: 4-7 minutes/ mild by palpation   MAU Course  Procedures  MDM NST reactive  No fetal movement palpated by patient or CNM- BPP ordered  BPP 8/8 PO Flexeril given for back pain and pelvic pressure  Patient reports medication has help back pain and pelvic pressure but now there is more pressure in bottom  RN noted cervical change to 2.5cm/thick, patient discharge held at this time, will recheck cervix in 1 hour  No cervical change on reassessment  Dilation: 2.5 Effacement (%): Thick Station: -2 Presentation: Vertex Exam by:: Suezanne JacquetJ Williams, RN  Pt discharged home in stable condition, discussed reasons to return to MAU, follow up as scheduled on Thursday for prenatal care.   Assessment and Plan   1. Decreased fetal movement affecting management of pregnancy in third trimester, single or unspecified fetus   2. NST (non-stress test) reactive   3. Back pain during pregnancy    Discharge home Follow up as scheduled on Thursday with Dr Shawnie Ponsorn  Return to MAU as needed  NST reactive and BPP 8/8 Labor precautions   Follow-up Information    [facility] Follow up.   Specialty: Obstetrics and Gynecology Why: Return to MAU as needed for emergencies, follow up as scheduled with Dr Shawnie Ponsorn on Thursday for prenatal care  Contact information: [address] [id number] mc [address] [place] [phone]         Sharyon CableVeronica C Bionca Mckey CNM 06/25/2019, 12:25 AM

## 2019-06-25 ENCOUNTER — Encounter (HOSPITAL_COMMUNITY): Payer: Self-pay

## 2019-06-26 ENCOUNTER — Inpatient Hospital Stay (HOSPITAL_COMMUNITY)
Admission: AD | Admit: 2019-06-26 | Discharge: 2019-06-26 | Disposition: A | Discharge status: Home / Self Care | Payer: Medicaid Other | Attending: Obstetrics and Gynecology | Admitting: Obstetrics and Gynecology

## 2019-06-26 ENCOUNTER — Encounter (HOSPITAL_COMMUNITY): Payer: Self-pay

## 2019-06-26 ENCOUNTER — Other Ambulatory Visit: Payer: Self-pay

## 2019-06-26 DIAGNOSIS — O471 False labor at or after 37 completed weeks of gestation: Secondary | ICD-10-CM | POA: Diagnosis present

## 2019-06-26 DIAGNOSIS — O479 False labor, unspecified: Secondary | ICD-10-CM

## 2019-06-26 DIAGNOSIS — Z3A38 38 weeks gestation of pregnancy: Secondary | ICD-10-CM | POA: Insufficient documentation

## 2019-06-26 NOTE — MAU Note (Signed)
Pt states that she has been having ctx's since Sunday. Today the pain is now more intense.   Pt states her ctx's are 2-3 minutes apart.    Denies LOF and vaginal bleeding.   Pt reports +FM

## 2019-06-26 NOTE — Discharge Instructions (Signed)
Braxton Hicks Contractions Contractions of the uterus can occur throughout pregnancy, but they are not always a sign that you are in labor. You may have practice contractions called Braxton Hicks contractions. These false labor contractions are sometimes confused with true labor. What are Braxton Hicks contractions? Braxton Hicks contractions are tightening movements that occur in the muscles of the uterus before labor. Unlike true labor contractions, these contractions do not result in opening (dilation) and thinning of the cervix. Toward the end of pregnancy (32-34 weeks), Braxton Hicks contractions can happen more often and may become stronger. These contractions are sometimes difficult to tell apart from true labor because they can be very uncomfortable. You should not feel embarrassed if you go to the hospital with false labor. Sometimes, the only way to tell if you are in true labor is for your health care provider to look for changes in the cervix. The health care provider will do a physical exam and may monitor your contractions. If you are not in true labor, the exam should show that your cervix is not dilating and your water has not broken. If there are no other health problems associated with your pregnancy, it is completely safe for you to be sent home with false labor. You may continue to have Braxton Hicks contractions until you go into true labor. How to tell the difference between true labor and false labor True labor  Contractions last 30-70 seconds.  Contractions become very regular.  Discomfort is usually felt in the top of the uterus, and it spreads to the lower abdomen and low back.  Contractions do not go away with walking.  Contractions usually become more intense and increase in frequency.  The cervix dilates and gets thinner. False labor  Contractions are usually shorter and not as strong as true labor contractions.  Contractions are usually irregular.  Contractions  are often felt in the front of the lower abdomen and in the groin.  Contractions may go away when you walk around or change positions while lying down.  Contractions get weaker and are shorter-lasting as time goes on.  The cervix usually does not dilate or become thin. Follow these instructions at home:   Take over-the-counter and prescription medicines only as told by your health care provider.  Keep up with your usual exercises and follow other instructions from your health care provider.  Eat and drink lightly if you think you are going into labor.  If Braxton Hicks contractions are making you uncomfortable: ? Change your position from lying down or resting to walking, or change from walking to resting. ? Sit and rest in a tub of warm water. ? Drink enough fluid to keep your urine pale yellow. Dehydration may cause these contractions. ? Do slow and deep breathing several times an hour.  Keep all follow-up prenatal visits as told by your health care provider. This is important. Contact a health care provider if:  You have a fever.  You have continuous pain in your abdomen. Get help right away if:  Your contractions become stronger, more regular, and closer together.  You have fluid leaking or gushing from your vagina.  You pass blood-tinged mucus (bloody show).  You have bleeding from your vagina.  You have low back pain that you never had before.  You feel your baby's head pushing down and causing pelvic pressure.  Your baby is not moving inside you as much as it used to. Summary  Contractions that occur before labor are   called Braxton Hicks contractions, false labor, or practice contractions.  Braxton Hicks contractions are usually shorter, weaker, farther apart, and less regular than true labor contractions. True labor contractions usually become progressively stronger and regular, and they become more frequent.  Manage discomfort from Braxton Hicks contractions  by changing position, resting in a warm bath, drinking plenty of water, or practicing deep breathing. This information is not intended to replace advice given to you by your health care provider. Make sure you discuss any questions you have with your health care provider. Document Released: 04/28/2017 Document Revised: 11/25/2017 Document Reviewed: 04/28/2017 Elsevier Patient Education  2020 [employer]  

## 2019-10-03 ENCOUNTER — Encounter (HOSPITAL_COMMUNITY): Payer: Self-pay

## 2020-02-19 ENCOUNTER — Ambulatory Visit: Payer: Medicaid Other | Attending: Internal Medicine

## 2020-02-19 DIAGNOSIS — Z20822 Contact with and (suspected) exposure to covid-19: Secondary | ICD-10-CM

## 2020-02-20 LAB — NOVEL CORONAVIRUS, NAA

## 2020-02-21 ENCOUNTER — Ambulatory Visit: Payer: Medicaid Other
